# Patient Record
Sex: Female | Born: 2000 | Race: Black or African American | Hispanic: No | Marital: Single | Smoking: Never smoker
Health system: Southern US, Community
[De-identification: ages and names within clinical notes are randomized; demographics above are authoritative.]

## PROBLEM LIST (undated history)

## (undated) DIAGNOSIS — J45909 Unspecified asthma, uncomplicated: Secondary | ICD-10-CM

## (undated) HISTORY — PX: KNEE SURGERY: SHX244

---
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
## 2019-10-01 ENCOUNTER — Encounter (HOSPITAL_COMMUNITY): Payer: Self-pay | Admitting: Emergency Medicine

## 2019-10-01 ENCOUNTER — Emergency Department (HOSPITAL_COMMUNITY): Payer: Medicaid Other

## 2019-10-01 ENCOUNTER — Emergency Department (HOSPITAL_COMMUNITY)
Admission: EM | Admit: 2019-10-01 | Discharge: 2019-10-01 | Disposition: A | Discharge status: Home / Self Care | Payer: Medicaid Other | Attending: Emergency Medicine | Admitting: Emergency Medicine

## 2019-10-01 ENCOUNTER — Other Ambulatory Visit: Payer: Self-pay

## 2019-10-01 DIAGNOSIS — Y999 Unspecified external cause status: Secondary | ICD-10-CM | POA: Insufficient documentation

## 2019-10-01 DIAGNOSIS — Y92410 Unspecified street and highway as the place of occurrence of the external cause: Secondary | ICD-10-CM | POA: Diagnosis not present

## 2019-10-01 DIAGNOSIS — M542 Cervicalgia: Secondary | ICD-10-CM | POA: Diagnosis not present

## 2019-10-01 DIAGNOSIS — M5489 Other dorsalgia: Secondary | ICD-10-CM | POA: Diagnosis present

## 2019-10-01 DIAGNOSIS — Y9389 Activity, other specified: Secondary | ICD-10-CM | POA: Diagnosis not present

## 2019-10-01 MED ORDER — NAPROXEN 500 MG PO TABS: 500.0000 mg | ORAL_TABLET | Freq: Two times a day (BID) | ORAL | 0 refills | Status: DC | PRN

## 2019-10-01 NOTE — ED Notes (Signed)
Patient verbalizes understanding of discharge instructions. Opportunity for questioning and answers were provided. Armband removed by staff, pt discharged from ED.  

## 2019-10-01 NOTE — Discharge Instructions (Addendum)
Please take medications, as prescribed.  Please read the attachment.  Return to the ED or seek medical attention for any new or worsening symptoms.
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
## 2019-11-17 NOTE — L&D Delivery Note (Addendum)
Alyssa Mercado is a 19 y.o.@ s/p vaginal delivery at [redacted]w[redacted]d.  She was admitted for IOL for PPROM @ 35.5w.    ROM: 14h 31m with clear fluid GBS Status: unknown > PCN Maximum Maternal Temperature: 98.33F   Labor Progress: Pt in latent labor on admission with CVE of 2/50/-3.  She continued to progress well after cytotec x1. She progressed to complete then delivered without complication as noted below.   Delivery Date/Time: 10/23 at 1631 Delivery: Called to room and patient was complete and pushing. Head delivered LOA. Nuchal cord x1, easily reduced. Shoulder and body delivered in usual fashion. Infant with spontaneous cry, placed on mother's abdomen, dried and stimulated. Cord clamped x 2 after 1-minute delay, and cut by FOB under my direct supervision. Cord blood drawn. Placenta delivered spontaneously with gentle cord traction. Fundus firm with massage and Pitocin. Labia, perineum, vagina, and cervix were inspected; patient sustained bilateral labial lacerations and a hemostatic first degree perineal laceration.  The labial lacerations were numbed with 1% lidocaine injection and repaired with 3-0 vicryl rapide.    Placenta: intact, 3-vessel cord, sent to L&D Complications: Nuchal cord x1, easily reduced Lacerations: bilateral labial lacerations and a hemostatic first degree perineal laceration EBL: 300 ml Analgesia: epidural   Infant: female  APGARs 8 & 9  weight per medical record  Fayette Pho, MD    Midwife attestation: I was gloved and present for delivery in its entirety and I agree with the above resident's note.  Donette Larry, CNM 6:33 PM

## 2020-03-04 ENCOUNTER — Ambulatory Visit (HOSPITAL_COMMUNITY)
Admission: EM | Admit: 2020-03-04 | Discharge: 2020-03-04 | Disposition: A | Discharge status: Home / Self Care | Payer: Medicaid Other | Attending: Family Medicine | Admitting: Family Medicine

## 2020-03-04 ENCOUNTER — Other Ambulatory Visit: Payer: Self-pay

## 2020-03-04 ENCOUNTER — Encounter (HOSPITAL_COMMUNITY): Payer: Self-pay

## 2020-03-04 DIAGNOSIS — A599 Trichomoniasis, unspecified: Secondary | ICD-10-CM

## 2020-03-04 DIAGNOSIS — Z3201 Encounter for pregnancy test, result positive: Secondary | ICD-10-CM | POA: Diagnosis not present

## 2020-03-04 LAB — POCT URINALYSIS DIP (DEVICE)
Bilirubin Urine: NEGATIVE
Glucose, UA: NEGATIVE mg/dL
Ketones, ur: >=160 mg/dL — AB
Nitrite: POSITIVE — AB
Protein, ur: 30 mg/dL — AB
Specific Gravity, Urine: 1.025 (ref 1.005–1.030)
Urobilinogen, UA: 0.2 mg/dL (ref 0.0–1.0)
pH: 6.5 (ref 5.0–8.0)

## 2020-03-04 LAB — POC URINE PREG, ED: Preg Test, Ur: POSITIVE — AB

## 2020-03-04 LAB — POCT PREGNANCY, URINE: Preg Test, Ur: POSITIVE — AB

## 2020-03-04 MED ORDER — METRONIDAZOLE 500 MG PO TABS: 500.0000 mg | ORAL_TABLET | Freq: Two times a day (BID) | ORAL | 0 refills | Status: DC

## 2020-03-04 NOTE — ED Notes (Signed)
Accessed patient's chart to obtain the orders for the cytology specimen 

## 2020-03-04 NOTE — ED Provider Notes (Signed)
[facility]    CSN: [id number] Arrival date & time: 03/04/20  1441      History   Chief Complaint Chief Complaint  Patient presents with  . Possible Pregnancy    HPI Alyssa Mercado is a 19 y.o. female.   HPI  Patient states that she has been to her primary care doctor.  She was tested and found positive for pregnancy.  She was also tested for STDs.  She is positive for trichomonas.  She received a call from her OB/GYN office.  They stated that she was positive for trichomonas, but they were uncomfortable providing treatments because of her pregnancy status.  She is here today to be treated for trichomonas, confirm her pregnancy, and be referred to OB/GYN for follow-up care. She feels well.  A little fatigue. She states that she has had a couple episodes in the last 3 days of some pink to brown spotting.  Its only present in the morning.  Not the rest of the day.  No cramping or abdominal pain.  I told her that this may be from the infection, and can be followed up by her GYN.  Past Medical History:  Diagnosis Date  . Asthma     There are no problems to display for this patient.   Past Surgical History:  Procedure Laterality Date  . KNEE SURGERY      OB History   No obstetric history on file.      Home Medications    Prior to Admission medications   Medication Sig Start Date End Date Taking? Authorizing Provider  metroNIDAZOLE (FLAGYL) 500 MG tablet Take 1 tablet (500 mg total) by mouth 2 (two) times daily. 03/04/20   Eustace Moore, MD  cetirizine (ZYRTEC) 10 MG tablet Take 1 tablet (10 mg total) by mouth daily. 07/04/16 03/04/20  Tharon Aquas, PA    Family History History reviewed. No pertinent family history.  Social History Social History   Tobacco Use  . Smoking status: Never Smoker  . Smokeless tobacco: Never Used  Substance Use Topics  . Alcohol use: No  . Drug use: Yes    Types: Marijuana     Allergies   Patient  has no known allergies.   Review of Systems Review of Systems  Genitourinary: Positive for menstrual problem and vaginal bleeding.   See HPI  Physical Exam Triage Vital Signs ED Triage Vitals  Enc Vitals Group     BP 03/04/20 1522 104/62     Pulse Rate 03/04/20 1522 76     Resp 03/04/20 1522 16     Temp 03/04/20 1522 98.2 F (36.8 C)     Temp Source 03/04/20 1522 Oral     SpO2 03/04/20 1522 100 %     Weight 03/04/20 1517 116 lb (52.6 kg)     Height --      Head Circumference --      Peak Flow --      Pain Score 03/04/20 1517 5     Pain Loc --      Pain Edu? --      Excl. in GC? --    No data found.  Updated Vital Signs BP 104/62 (BP Location: Right Arm)   Pulse 76   Temp 98.2 F (36.8 C) (Oral)   Resp 16   Wt 52.6 kg   SpO2 100%   BMI 18.72 kg/m     Physical Exam Constitutional:      General:  She is not in acute distress.    Appearance: She is well-developed and normal weight.  HENT:     Head: Normocephalic and atraumatic.     Mouth/Throat:     Comments: Mask is in place Eyes:     Conjunctiva/sclera: Conjunctivae normal.     Pupils: Pupils are equal, round, and reactive to light.  Cardiovascular:     Rate and Rhythm: Normal rate.  Pulmonary:     Effort: Pulmonary effort is normal. No respiratory distress.  Abdominal:     General: There is no distension.     Palpations: Abdomen is soft.     Tenderness: There is no abdominal tenderness.  Musculoskeletal:        General: Normal range of motion.     Cervical back: Normal range of motion.  Skin:    General: Skin is warm and dry.  Neurological:     Mental Status: She is alert.  Psychiatric:        Mood and Affect: Mood normal.        Behavior: Behavior normal.      UC Treatments / Results  Labs (all labs ordered are listed, but only abnormal results are displayed) Labs Reviewed  POC URINE PREG, ED - Abnormal; Notable for the following components:      Result Value   Preg Test, Ur POSITIVE (*)     All other components within normal limits  POCT URINALYSIS DIP (DEVICE) - Abnormal; Notable for the following components:   Ketones, ur >=160 (*)    Hgb urine dipstick TRACE (*)    Protein, ur 30 (*)    Nitrite POSITIVE (*)    Leukocytes,Ua SMALL (*)    All other components within normal limits  POCT PREGNANCY, URINE - Abnormal; Notable for the following components:   Preg Test, Ur POSITIVE (*)    All other components within normal limits    EKG   Radiology No results found.  Procedures Procedures (including critical care time)  Medications Ordered in UC Medications - No data to display  Initial Impression / Assessment and Plan / UC Course  I have reviewed the triage vital signs and the nursing notes.  Pertinent labs & imaging results that were available during my care of the patient were reviewed by me and considered in my medical decision making (see chart for details).     Reviewed early pregnancies.  Diet.  Exercise.  Vitamins.  Supplements. Needs to go to the women's emergency room for any increased bleeding or pain during her pregnancy She can check for her test results in MyChart.  She will be called if anything is positive Final Clinical Impressions(s) / UC Diagnoses   Final diagnoses:  Positive pregnancy test  Trichomonas infection     Discharge Instructions     Take the antibiotic for 7 days No sexual encounters until you finish the antibiotics If you have any bleeding, pain or pregnancy complications go to the [facility] ER Call for an appointment today Take prenatal vitamin daily No tobacco, alcohol or herbals until you see OB      ED Prescriptions    Medication Sig Dispense Auth. Provider   metroNIDAZOLE (FLAGYL) 500 MG tablet Take 1 tablet (500 mg total) by mouth 2 (two) times daily. 14 tablet Raylene Everts, MD     PDMP not reviewed this encounter.   Raylene Everts, MD 03/04/20 [phone]

## 2020-03-04 NOTE — Discharge Instructions (Addendum)
Take the antibiotic for 7 days No sexual encounters until you finish the antibiotics If you have any bleeding, pain or pregnancy complications go to the [facility] ER Call for an appointment today Take prenatal vitamin daily No tobacco, alcohol or herbals until you see OB

## 2020-03-04 NOTE — ED Triage Notes (Signed)
Pt states she is pregnant. Pt states her LMP 12/31/18 she thinks. Pt has been spotting 3 days. Pt states she would like to be tested for STDs.

## 2020-03-05 LAB — CERVICOVAGINAL ANCILLARY ONLY
Chlamydia: NEGATIVE
Comment: NEGATIVE
Comment: NEGATIVE
Comment: NORMAL
Neisseria Gonorrhea: NEGATIVE
Trichomonas: POSITIVE — AB

## 2020-03-06 ENCOUNTER — Telehealth (HOSPITAL_COMMUNITY): Payer: Self-pay

## 2020-03-06 LAB — URINE CULTURE: Culture: >=100000 — AB

## 2020-03-06 MED ORDER — CEPHALEXIN 500 MG PO CAPS: 500.0000 mg | ORAL_CAPSULE | Freq: Two times a day (BID) | ORAL | 0 refills | Status: AC

## 2020-04-08 ENCOUNTER — Ambulatory Visit (INDEPENDENT_AMBULATORY_CARE_PROVIDER_SITE_OTHER): Payer: Medicaid Other | Admitting: *Deleted

## 2020-04-08 ENCOUNTER — Encounter: Payer: Self-pay | Admitting: General Practice

## 2020-04-08 ENCOUNTER — Other Ambulatory Visit: Payer: Self-pay

## 2020-04-08 VITALS — BP 104/59 | HR 81 | Temp 98.1°F | Wt 125.8 lb

## 2020-04-08 DIAGNOSIS — Z3400 Encounter for supervision of normal first pregnancy, unspecified trimester: Secondary | ICD-10-CM | POA: Insufficient documentation

## 2020-04-08 DIAGNOSIS — Z789 Other specified health status: Secondary | ICD-10-CM

## 2020-04-08 MED ORDER — VITAFOL GUMMIES 3.33-0.333-34.8 MG PO CHEW: 3.0000 | CHEWABLE_TABLET | Freq: Every day | ORAL | 12 refills | Status: AC

## 2020-04-08 MED ORDER — BLOOD PRESSURE MONITOR AUTOMAT DEVI: 1.0000 | Freq: Every day | 0 refills | Status: DC

## 2020-04-08 MED ORDER — GOJJI WEIGHT SCALE MISC: 1.0000 | Freq: Every day | 0 refills | Status: DC | PRN

## 2020-04-08 NOTE — Progress Notes (Signed)
   PRENATAL INTAKE SUMMARY  Ms. Hulon presents today New OB Nurse Interview.  OB History    Gravida  2   Para      Term      Preterm      AB  1   Living        SAB      TAB      Ectopic      Multiple      Live Births             I have reviewed the patient's medical, obstetrical, social, and family histories, medications, and available lab results.  SUBJECTIVE She has no unusual complaints  OBJECTIVE Initial nurse interview (New OB)  EDD: 10/07/2020 by LMP GA: [redacted]w[redacted]d G2P0010 FHT: 154  GENERAL APPEARANCE: alert, well appearing, in no apparent distress, oriented to person, place and time   ASSESSMENT Normal pregnancy  PLAN Prenatal care-[facility] Pnl/HIV  OB Urine Culture GC/CT/PAP at next visit with [name], CNM 05/01/20 HgbEval/SMA/CF ([facility]) Panorama RX PNV sent to pharmacy RX BP monitor and weight scale sent to [facility] to sign up for Babyscripts.  Clovis Pu, RN

## 2020-04-08 NOTE — Patient Instructions (Addendum)
 Genetic Screening Results Information: You are having genetic testing called [facility] today.  It will take approximately 2 weeks before the results are available.  To get your results, you need Internet access to a web browser to search [place]/MyChart (the direct app on your phone will not give you these results).  Then select Lab Scanned and click on the blue hyper link that says View Image to see your [facility]ama results.  You can also use the directions on the purple card given to look up your results directly on the [facility] website.   Second Trimester of Pregnancy  The second trimester is from week 14 through week 27 (month 4 through 6). This is often the time in pregnancy that you feel your best. Often times, morning sickness has lessened or quit. You may have more energy, and you may get hungry more often. Your unborn baby is growing rapidly. At the end of the sixth month, he or she is about 9 inches long and weighs about 1 pounds. You will likely feel the baby move between 18 and 20 weeks of pregnancy. Follow these instructions at home: Medicines  Take over-the-counter and prescription medicines only as told by your doctor. Some medicines are safe and some medicines are not safe during pregnancy.  Take a prenatal vitamin that contains at least 600 micrograms (mcg) of folic acid.  If you have trouble pooping (constipation), take medicine that will make your stool soft (stool softener) if your doctor approves. Eating and drinking   Eat regular, healthy meals.  Avoid raw meat and uncooked cheese.  If you get low calcium from the food you eat, talk to your doctor about taking a daily calcium supplement.  Avoid foods that are high in fat and sugars, such as fried and sweet foods.  If you feel sick to your stomach (nauseous) or throw up (vomit): ? Eat 4 or 5 small meals a day instead of 3 large meals. ? Try eating a few soda crackers. ? Drink liquids between meals instead of during  meals.  To prevent constipation: ? Eat foods that are high in fiber, like fresh fruits and vegetables, whole grains, and beans. ? Drink enough fluids to keep your pee (urine) clear or pale yellow. Activity  Exercise only as told by your doctor. Stop exercising if you start to have cramps.  Do not exercise if it is too hot, too humid, or if you are in a place of great height (high altitude).  Avoid heavy lifting.  Wear low-heeled shoes. Sit and stand up straight.  You can continue to have sex unless your doctor tells you not to. Relieving pain and discomfort  Wear a good support bra if your breasts are tender.  Take warm water baths (sitz baths) to soothe pain or discomfort caused by hemorrhoids. Use hemorrhoid cream if your doctor approves.  Rest with your legs raised if you have leg cramps or low back pain.  If you develop puffy, bulging veins (varicose veins) in your legs: ? Wear support hose or compression stockings as told by your doctor. ? Raise (elevate) your feet for 15 minutes, 3-4 times a day. ? Limit salt in your food. Prenatal care  Write down your questions. Take them to your prenatal visits.  Keep all your prenatal visits as told by your doctor. This is important. Safety  Wear your seat belt when driving.  Make a list of emergency phone numbers, including numbers for family, friends, the hospital, and police and   Public relations account executive. General instructions  Ask your doctor about the right foods to eat or for help finding a counselor, if you need these services.  Ask your doctor about local prenatal classes. Begin classes before month 6 of your pregnancy.  Do not use hot tubs, steam rooms, or saunas.  Do not douche or use tampons or scented sanitary pads.  Do not cross your legs for long periods of time.  Visit your dentist if you have not done so. Use a soft toothbrush to brush your teeth. Floss gently.  Avoid all smoking, herbs, and alcohol. Avoid drugs  that are not approved by your doctor.  Do not use any products that contain nicotine or tobacco, such as cigarettes and e-cigarettes. If you need help quitting, ask your doctor.  Avoid cat litter boxes and soil used by cats. These carry germs that can cause birth defects in the baby and can cause a loss of your baby (miscarriage) or stillbirth. Contact a doctor if:  You have mild cramps or pressure in your lower belly.  You have pain when you pee (urinate).  You have bad smelling fluid coming from your vagina.  You continue to feel sick to your stomach (nauseous), throw up (vomit), or have watery poop (diarrhea).  You have a nagging pain in your belly area.  You feel dizzy. Get help right away if:  You have a fever.  You are leaking fluid from your vagina.  You have spotting or bleeding from your vagina.  You have severe belly cramping or pain.  You lose or gain weight rapidly.  You have trouble catching your breath and have chest pain.  You notice sudden or extreme puffiness (swelling) of your face, hands, ankles, feet, or legs.  You have not felt the baby move in over an hour.  You have severe headaches that do not go away when you take medicine.  You have trouble seeing. Summary  The second trimester is from week 14 through week 27 (months 4 through 6). This is often the time in pregnancy that you feel your best.  To take care of yourself and your unborn baby, you will need to eat healthy meals, take medicines only if your doctor tells you to do so, and do activities that are safe for you and your baby.  Call your doctor if you get sick or if you notice anything unusual about your pregnancy. Also, call your doctor if you need help with the right food to eat, or if you want to know what activities are safe for you. This information is not intended to replace advice given to you by your health care provider. Make sure you discuss any questions you have with your health  care provider. Document Revised: 02/24/2019 Document Reviewed: 12/08/2016 Elsevier Patient Education  2020 [employer].  Warning Signs During Pregnancy A pregnancy lasts about 40 weeks, starting from the first day of your last period until the baby is born. Pregnancy is divided into three phases called trimesters.  The first trimester refers to week 1 through week 13 of pregnancy.  The second trimester is the start of week 14 through the end of week 27.  The third trimester is the start of week 28 until you deliver your baby. During each trimester of pregnancy, certain signs and symptoms may indicate a problem. Talk with your health care provider about your current health and any medical conditions you have. Make sure you know the symptoms that you should watch  for and report. How does this affect me?  Warning signs in the first trimester While some changes during the first trimester may be uncomfortable, most do not represent a serious problem. Let your health care provider know if you have any of the following warning signs in the first trimester:  You cannot eat or drink without vomiting, and this lasts for longer than a day.  You have vaginal bleeding or spotting along with menstrual-like cramping.  You have diarrhea for longer than a day.  You have a fever or other signs of infection, such as: ? Pain or burning when you urinate. ? Foul smelling or thick or yellowish vaginal discharge. Warning signs in the second trimester As your baby grows and changes during the second trimester, there are additional signs and symptoms that may indicate a problem. These include:  Signs and symptoms of infection, including a fever.  Signs or symptoms of a miscarriage or preterm labor, such as regular contractions, menstrual-like cramping, or lower abdominal pain.  Bloody or watery vaginal discharge or obvious vaginal bleeding.  Feeling like your heart is pounding.  Having trouble  breathing.  Nausea, vomiting, or diarrhea that lasts for longer than a day.  Craving non-food items, such as clay, chalk, or dirt. This may be a sign of a very treatable medical condition called pica. Later in your second trimester, watch for signs and symptoms of a serious medical condition called preeclampsia.These include:  Changes in your vision.  A severe headache that does not go away.  Nausea and vomiting. It is also important to notice if your baby stops moving or moves less than usual during this time. Warning signs in the third trimester As you approach the third trimester, your baby is growing and your body is preparing for the birth of your baby. In your third trimester, be sure to let your health care provider know if:  You have signs and symptoms of infection, including a fever.  You have vaginal bleeding.  You notice that your baby is moving less than usual or is not moving.  You have nausea, vomiting, or diarrhea that lasts for longer than a day.  You have a severe headache that does not go away.  You have vision changes, including seeing spots or having blurry or double vision.  You have increased swelling in your hands or face. How does this affect my baby? Throughout your pregnancy, always report any of the warning signs of a problem to your health care provider. This can help prevent complications that may affect your baby, including:  Increased risk for premature birth.  Infection that may be transmitted to your baby.  Increased risk for stillbirth. Contact a health care provider if:  You have any of the warning signs of a problem for the current trimester of your pregnancy.  Any of the following apply to you during any trimester of pregnancy: ? You have strong emotions, such as sadness or anxiety, that interfere with work or personal relationships. ? You feel unsafe in your home and need help finding a safe place to live. ? You are using tobacco  products, alcohol, or drugs and you need help to stop. Get help right away if: You have signs or symptoms of labor before 37 weeks of pregnancy. These include:  Contractions that are 5 minutes or less apart, or that increase in frequency, intensity, or length.  Sudden, sharp abdominal pain or low back pain.  Uncontrolled gush or trickle of fluid from   your vagina. Summary  A pregnancy lasts about 40 weeks, starting from the first day of your last period until the baby is born. Pregnancy is divided into three phases called trimesters. Each trimester has warning signs to watch for.  Always report any warning signs to your health care provider in order to prevent complications that may affect both you and your baby.  Talk with your health care provider about your current health and any medical conditions you have. Make sure you know the symptoms that you should watch for and report. This information is not intended to replace advice given to you by your health care provider. Make sure you discuss any questions you have with your health care provider. Document Revised: 02/21/2019 Document Reviewed: 08/19/2017 Elsevier Patient Education  [place] During Pregnancy Genetic testing during pregnancy is also called prenatal genetic testing. This type of testing can determine if your baby is at risk of being born with a disorder caused by abnormal genes or chromosomes (genetic disorder). Chromosomes contain genes that control how your baby will develop in your womb. There are many different genetic disorders. Examples of genetic disorders that may be found through genetic testing include Down syndrome and cystic fibrosis. Gene changes (mutations) can be passed down through families. Genetic testing is offered to all women before or during pregnancy. You can choose whether to have genetic testing. Why is genetic testing done? Genetic testing is done during pregnancy to find out  whether your child is at risk for a genetic disorder. Having genetic testing allows you to:  Discuss your test results and options with a genetic counselor.  Prepare for a baby that may be born with a genetic disorder. Learning about the disorder ahead of time helps you be better prepared to manage it. Your health care providers can also be prepared in case your baby requires special care before or after birth.  Consider whether you want to continue with the pregnancy. In some cases, genetic testing may be done to learn about the traits a child will inherit. Types of genetic tests There are two basic types of genetic testing. Screening tests indicate whether your developing baby (fetus) is at higher risk for a genetic disorder. Diagnostic tests check actual fetal cells to diagnose a genetic disorder. Screening tests     Screening tests will not harm your baby. They are recommended for all pregnant women. Types of screening tests include:  Carrier screening. This test involves checking genes from both parents by testing their blood or saliva. The test checks to find out if the parents carry a genetic mutation that may be passed to a baby. In most cases, both parents must carry the mutation for a baby to be at risk.  First trimester screening. This test combines a blood test with sound wave imaging of your baby (fetal ultrasound). This screening test checks for a risk of Down syndrome or other defects caused by having extra chromosomes. It also checks for defects of the heart, abdomen, or skeleton.  Second trimester screening also combines a blood test with a fetal ultrasound exam. It checks for a risk of genetic defects of the face, brain, spine, heart, or limbs.  Combined or sequential screening. This type of testing combines the results of first and second trimester screening. This type of testing may be more accurate than first or second trimester screening alone.  Cell-free DNA testing.  This is a blood test that detects cells released by  the placenta that get into the mother's blood. It can be used to check for a risk of Down syndrome, other extra chromosome syndromes, and disorders caused by abnormal numbers of sex chromosomes. This test can be done any time after 10 weeks of pregnancy.  Diagnostic tests Diagnostic tests carry slight risks of problems, including bleeding, infection, and loss of the pregnancy. These tests are done only if your baby is at risk for a genetic disorder. You may meet with a genetic counselor to discuss the risks and benefits before having diagnostic tests. Examples of diagnostic tests include:  Chorionic villus sampling (CVS). This involves a procedure to remove and test a sample of cells taken from the placenta. The procedure may be done between 10 and 12 weeks of pregnancy.  Amniocentesis. This involves a procedure to remove and test a sample of fluid (amniotic fluid) and cells from the sac that surrounds the developing baby. The procedure may be done between 15 and 20 weeks of pregnancy. What do the results mean? For a screening test:  If the results are negative, it often means that your child is not at higher risk. There is still a slight chance your child could have a genetic disorder.  If the results are positive, it does not mean your child will have a genetic disorder. It may mean that your child has a higher-than-normal risk for a genetic disorder. In that case, you may want to talk with a genetic counselor about whether you should have diagnostic genetic tests. For a diagnostic test:  If the result is negative, it is unlikely that your child will have a genetic disorder.  If the test is positive for a genetic disorder, it is likely that your child will have the disorder. The test may not tell how severe the disorder will be. Talk with your health care provider about your options. Questions to ask your health care provider Before talking to  your health care provider about genetic testing, find out if there is a history of genetic disorders in your family. It may also help to know your family's ethnic origins. Then ask your health care provider the following questions:  Is my baby at risk for a genetic disorder?  What are the benefits of having genetic screening?  What tests are best for me and my baby?  What are the risks of each test?  If I get a positive result on a screening test, what is the next step?  Should I meet with a genetic counselor before having a diagnostic test?  Should my partner or other members of my family be tested?  How much do the tests cost? Will my insurance cover the testing? Summary  Genetic testing is done during pregnancy to find out whether your child is at risk for a genetic disorder.  Genetic testing is offered to all women before or during pregnancy. You can choose whether to have genetic testing.  There are two basic types of genetic testing. Screening tests indicate whether your developing baby (fetus) is at higher risk for a genetic disorder. Diagnostic tests check actual fetal cells to diagnose a genetic disorder.  If a diagnostic genetic test is positive, talk with your health care provider about your options. This information is not intended to replace advice given to you by your health care provider. Make sure you discuss any questions you have with your health care provider. Document Revised: 02/23/2019 Document Reviewed: 01/17/2018 Elsevier Patient Education  2020 [employer].

## 2020-04-09 LAB — OBSTETRIC PANEL, INCLUDING HIV
Antibody Screen: NEGATIVE
Basophils Absolute: 0 10*3/uL (ref 0.0–0.2)
Basos: 0 %
EOS (ABSOLUTE): 0.2 10*3/uL (ref 0.0–0.4)
Eos: 2 %
HIV Screen 4th Generation wRfx: NONREACTIVE
Hematocrit: 36.3 % (ref 34.0–46.6)
Hemoglobin: 12.5 g/dL (ref 11.1–15.9)
Hepatitis B Surface Ag: NEGATIVE
Immature Grans (Abs): 0 10*3/uL (ref 0.0–0.1)
Immature Granulocytes: 0 %
Lymphocytes Absolute: 3.3 10*3/uL — ABNORMAL HIGH (ref 0.7–3.1)
Lymphs: 31 %
MCH: 33.2 pg — ABNORMAL HIGH (ref 26.6–33.0)
MCHC: 34.4 g/dL (ref 31.5–35.7)
MCV: 96 fL (ref 79–97)
Monocytes Absolute: 0.6 10*3/uL (ref 0.1–0.9)
Monocytes: 6 %
Neutrophils Absolute: 6.5 10*3/uL (ref 1.4–7.0)
Neutrophils: 61 %
Platelets: 348 10*3/uL (ref 150–450)
RBC: 3.77 x10E6/uL (ref 3.77–5.28)
RDW: 12.5 % (ref 11.7–15.4)
RPR Ser Ql: NONREACTIVE
Rh Factor: POSITIVE
Rubella Antibodies, IGG: <0.9 index — ABNORMAL LOW (ref >0.99)
WBC: 10.7 10*3/uL (ref 3.4–10.8)

## 2020-04-09 LAB — HEPATITIS C ANTIBODY: Hep C Virus Ab: <0.1 s/co ratio (ref 0.0–0.9)

## 2020-04-10 LAB — CULTURE, OB URINE

## 2020-04-10 LAB — URINE CULTURE, OB REFLEX

## 2020-04-16 ENCOUNTER — Encounter: Payer: Self-pay | Admitting: General Practice

## 2020-04-16 ENCOUNTER — Ambulatory Visit: Payer: Medicaid Other

## 2020-04-17 ENCOUNTER — Encounter: Payer: Self-pay | Admitting: General Practice

## 2020-05-01 ENCOUNTER — Telehealth: Payer: Self-pay | Admitting: General Practice

## 2020-05-01 ENCOUNTER — Encounter: Payer: Medicaid Other | Admitting: Obstetrics and Gynecology

## 2020-05-01 ENCOUNTER — Encounter: Payer: Self-pay | Admitting: General Practice

## 2020-05-01 NOTE — Telephone Encounter (Signed)
Pt missed NOB appt today.  Letter mailed to patient to give our office a call to reschedule.  Mychart message sent as well.

## 2020-06-06 ENCOUNTER — Ambulatory Visit (INDEPENDENT_AMBULATORY_CARE_PROVIDER_SITE_OTHER): Payer: Medicaid Other | Admitting: Obstetrics and Gynecology

## 2020-06-06 ENCOUNTER — Encounter: Payer: Self-pay | Admitting: General Practice

## 2020-06-06 ENCOUNTER — Encounter: Payer: Self-pay | Admitting: Obstetrics and Gynecology

## 2020-06-06 ENCOUNTER — Other Ambulatory Visit: Payer: Self-pay

## 2020-06-06 ENCOUNTER — Other Ambulatory Visit (HOSPITAL_COMMUNITY)
Admission: RE | Admit: 2020-06-06 | Discharge: 2020-06-06 | Disposition: A | Discharge status: Home / Self Care | Payer: Medicaid Other | Source: Ambulatory Visit | Attending: Obstetrics and Gynecology | Admitting: Obstetrics and Gynecology

## 2020-06-06 VITALS — BP 105/67 | HR 76 | Temp 97.9°F | Wt 136.2 lb

## 2020-06-06 DIAGNOSIS — Z124 Encounter for screening for malignant neoplasm of cervix: Secondary | ICD-10-CM

## 2020-06-06 DIAGNOSIS — O210 Mild hyperemesis gravidarum: Secondary | ICD-10-CM | POA: Diagnosis not present

## 2020-06-06 DIAGNOSIS — K219 Gastro-esophageal reflux disease without esophagitis: Secondary | ICD-10-CM | POA: Insufficient documentation

## 2020-06-06 DIAGNOSIS — Z3400 Encounter for supervision of normal first pregnancy, unspecified trimester: Secondary | ICD-10-CM | POA: Diagnosis present

## 2020-06-06 DIAGNOSIS — O23592 Infection of other part of genital tract in pregnancy, second trimester: Secondary | ICD-10-CM

## 2020-06-06 DIAGNOSIS — B9689 Other specified bacterial agents as the cause of diseases classified elsewhere: Secondary | ICD-10-CM

## 2020-06-06 DIAGNOSIS — O99612 Diseases of the digestive system complicating pregnancy, second trimester: Secondary | ICD-10-CM

## 2020-06-06 DIAGNOSIS — O285 Abnormal chromosomal and genetic finding on antenatal screening of mother: Secondary | ICD-10-CM

## 2020-06-06 DIAGNOSIS — Z3A22 22 weeks gestation of pregnancy: Secondary | ICD-10-CM

## 2020-06-06 MED ORDER — PANTOPRAZOLE SODIUM 40 MG PO TBEC: 40.0000 mg | DELAYED_RELEASE_TABLET | Freq: Every day | ORAL | 0 refills | Status: DC

## 2020-06-06 MED ORDER — ONDANSETRON 4 MG PO TBDP
8.0000 mg | ORAL_TABLET | Freq: Once | ORAL | Status: AC
  Administered 2020-06-06: 8 mg via ORAL

## 2020-06-06 MED ORDER — PROMETHAZINE HCL 25 MG PO TABS: 25.0000 mg | ORAL_TABLET | Freq: Four times a day (QID) | ORAL | 1 refills | Status: DC | PRN

## 2020-06-06 NOTE — Progress Notes (Signed)
INITIAL OBSTETRICAL VISIT Patient name: Alyssa Mercado MRN [id number]  Date of birth: [birth year] Chief Complaint:   Initial Prenatal Visit  History of Present Illness:   Alyssa Mercado is a 19 y.o. G56P0010 African American female at [redacted]w[redacted]d by LMP with an Estimated Date of Delivery: 10/07/20 being seen today for her initial obstetrical visit.  Her obstetrical history is significant for none. She is Rubella Non-Immune, SMA carrier, GERD and late to prenatal care. This is an unplanned pregnancy. She and the father of the baby (FOB) "Shaka" live together. She has a support system that consists of the FOB and her family and friends. Today she reports heartburn, nausea and vomiting.   Patient's last menstrual period was 01/01/2020 (within weeks). Last pap n/a. Results were: n/a Review of Systems:   Pertinent items are noted in HPI Denies cramping/contractions, leakage of fluid, vaginal bleeding, abnormal vaginal discharge w/ itching/odor/irritation, headaches, visual changes, shortness of breath, chest pain, abdominal pain, severe nausea/vomiting, or problems with urination or bowel movements unless otherwise stated above.  Pertinent History Reviewed:  Reviewed past medical,surgical, social, obstetrical and family history.  Reviewed problem list, medications and allergies. OB History  Gravida Para Term Preterm AB Living  2       1    SAB TAB Ectopic Multiple Live Births               # Outcome Date GA Lbr Len/2nd Weight Sex Delivery Anes PTL Lv  2 Current           1 AB 2017           Physical Assessment:   Vitals:   06/06/20 0951  BP: 105/67  Pulse: 76  Temp: 97.9 F (36.6 C)  Weight: 136 lb 3.2 oz (61.8 kg)  Body mass index is 21.98 kg/m.       Physical Examination:  General appearance - well appearing, and in no distress  Mental status - alert, oriented to person, place, and time  Psych:  She has a normal mood and affect  Skin - warm and dry,  normal color, no suspicious lesions noted  Chest - effort normal, all lung fields clear to auscultation bilaterally  Heart - normal rate and regular rhythm  Abdomen - soft, nontender  Extremities:  No swelling or varicosities noted  Pelvic - VULVA: normal appearing vulva with no masses, tenderness or lesions  VAGINA: normal appearing vagina with normal color and discharge, no lesions.   CERVIX: normal appearing cervix without discharge or lesions, no CMT  Thin prep pap is done with reflex HR HPV cotesting   FHTs by doppler: 140 bpm  Assessment & Plan:  1) Low-Risk Pregnancy G2P0010 at [redacted]w[redacted]d with an Estimated Date of Delivery: 10/07/20   2) Initial OB visit - Welcomed to practice and introduced self to patient in addition to discussing other advanced practice providers that she may be seeing at this practice - Congratulated patient - Anticipatory guidance on upcoming appointments - Educated on COVID19 and pregnancy and the integration of virtual appointments  - Educated on babyscripts app- patient reports she has not received email, encouraged to look in spam folder and to call office if she still has not received email - patient verbalizes understanding    3) Supervision of normal first pregnancy, antepartum - [place] MFM OB COMP + 14 WK; Future - Cytology - PAP( [facility]) - Cervicovaginal ancillary only( [facility])  4) Morning sickness - Rx for ondansetron (ZOFRAN-ODT) disintegrating  tablet 8 mg - Information provided on morning sickness   5) Gastroesophageal reflux during pregnancy in second trimester, antepartum - Rx for Protonix 40 mg daily - Information provided on GERD in pregnancy   6) Abnormal genetic test during pregnancy  - AMB MFM GENETICS REFERRAL    Meds:  Meds ordered this encounter  Medications  . ondansetron (ZOFRAN-ODT) disintegrating tablet 8 mg  . promethazine (PHENERGAN) 25 MG tablet    Sig: Take 1 tablet (25 mg total) by mouth every 6 (six) hours as  needed for nausea or vomiting.    Dispense:  30 tablet    Refill:  1  . pantoprazole (PROTONIX) 40 MG tablet    Sig: Take 1 tablet (40 mg total) by mouth daily.    Dispense:  30 tablet    Refill:  0    Order Specific Question:   Supervising Provider    Answer:   Reva Bores [[id number]]    Initial labs obtained Continue prenatal vitamins Reviewed n/v relief measures and warning s/s to report Reviewed recommended weight gain based on pre-gravid BMI Encouraged well-balanced diet Genetic Screening discussed: results reviewed Cystic fibrosis, SMA, Fragile X screening discussed results reviewed The nature of [facility] with multiple MDs and other Advanced Practice Providers was explained to patient; also emphasized that residents, students are part of our team.  Discussed optimized OB schedule and video visits. Advised can have an in-office visit whenever she feels she needs to be seen.  Does not have own BP cuff. BP cuff Rx faxed today. Explained to patient that BP will be mailed to her house. Check BP weekly, let us know if >140/90. Advised to call during normal business hours and there is an after-hours nurse line available.    Follow-up: Return in about 6 weeks (around 07/18/2020) for Return OB 2hr GTT.   Orders Placed This Encounter  Procedures  . [place] MFM OB COMP + 14 WK  . AMB MFM GENETICS REFERRAL    [name] MSN, CNM 06/06/2020

## 2020-06-06 NOTE — Patient Instructions (Signed)
Glucose Tolerance Test During Pregnancy Why am I having this test? The glucose tolerance test (GTT) is done to check how your body processes sugar (glucose). This is one of several tests used to diagnose diabetes that develops during pregnancy (gestational diabetes mellitus). Gestational diabetes is a temporary form of diabetes that some women develop during pregnancy. It usually occurs during the second trimester of pregnancy and goes away after delivery. Testing (screening) for gestational diabetes usually occurs between 24 and 28 weeks of pregnancy. You may have the GTT test after having a 1-hour glucose screening test if the results from that test indicate that you may have gestational diabetes. You may also have this test if:  You have a history of gestational diabetes.  You have a history of giving birth to very large babies or have experienced repeated fetal loss (stillbirth).  You have signs and symptoms of diabetes, such as: ? Changes in your vision. ? Tingling or numbness in your hands or feet. ? Changes in hunger, thirst, and urination that are not otherwise explained by your pregnancy. What is being tested? This test measures the amount of glucose in your blood at different times during a period of 3 hours. This indicates how well your body is able to process glucose. What kind of sample is taken?  Blood samples are required for this test. They are usually collected by inserting a needle into a blood vessel. How do I prepare for this test?  For 3 days before your test, eat normally. Have plenty of carbohydrate-rich foods.  Follow instructions from your health care provider about: ? Eating or drinking restrictions on the day of the test. You may be asked to not eat or drink anything other than water (fast) starting 8-10 hours before the test. ? Changing or stopping your regular medicines. Some medicines may interfere with this test. Tell a health care provider about:  All  medicines you are taking, including vitamins, herbs, eye drops, creams, and over-the-counter medicines.  Any blood disorders you have.  Any surgeries you have had.  Any medical conditions you have. What happens during the test? First, your blood glucose will be measured. This is referred to as your fasting blood glucose, since you fasted before the test. Then, you will drink a glucose solution that contains a certain amount of glucose. Your blood glucose will be measured again 1, 2, and 3 hours after drinking the solution. This test takes about 3 hours to complete. You will need to stay at the testing location during this time. During the testing period:  Do not eat or drink anything other than the glucose solution.  Do not exercise.  Do not use any products that contain nicotine or tobacco, such as cigarettes and e-cigarettes. If you need help stopping, ask your health care provider. The testing procedure may vary among health care providers and hospitals. How are the results reported? Your results will be reported as milligrams of glucose per deciliter of blood (mg/dL) or millimoles per liter (mmol/L). Your health care provider will compare your results to normal ranges that were established after testing a large group of people (reference ranges). Reference ranges may vary among labs and hospitals. For this test, common reference ranges are:  Fasting: less than 95-105 mg/dL (5.3-5.8 mmol/L).  1 hour after drinking glucose: less than 180-190 mg/dL (10.0-10.5 mmol/L).  2 hours after drinking glucose: less than 155-165 mg/dL (8.6-9.2 mmol/L).  3 hours after drinking glucose: 140-145 mg/dL (7.8-8.1 mmol/L). What do the   results mean? Results within reference ranges are considered normal, meaning that your glucose levels are well-controlled. If two or more of your blood glucose levels are high, you may be diagnosed with gestational diabetes. If only one level is high, your health care  provider may suggest repeat testing or other tests to confirm a diagnosis. Talk with your health care provider about what your results mean. Questions to ask your health care provider Ask your health care provider, or the department that is doing the test:  When will my results be ready?  How will I get my results?  What are my treatment options?  What other tests do I need?  What are my next steps? Summary  The glucose tolerance test (GTT) is one of several tests used to diagnose diabetes that develops during pregnancy (gestational diabetes mellitus). Gestational diabetes is a temporary form of diabetes that some women develop during pregnancy.  You may have the GTT test after having a 1-hour glucose screening test if the results from that test indicate that you may have gestational diabetes. You may also have this test if you have any symptoms or risk factors for gestational diabetes.  Talk with your health care provider about what your results mean. This information is not intended to replace advice given to you by your health care provider. Make sure you discuss any questions you have with your health care provider. Document Revised: 02/23/2019 Document Reviewed: 06/14/2017 Elsevier Patient Education  2020 Else[employer] Inc.   Morning Sickness  Morning sickness is when a woman feels nauseous during pregnancy. This nauseous feeling may or may not come with vomiting. It often occurs in the morning, but it can be a problem at any time of day. Morning sickness is most common during the first trimester. In some cases, it may continue throughout pregnancy. Although morning sickness is unpleasant, it is usually harmless unless the woman develops severe and continual vomiting (hyperemesis gravidarum), a condition that requires more intense treatment. What are the causes? The exact cause of this condition is not known, but it seems to be related to normal hormonal changes that occur in  pregnancy. What increases the risk? You are more likely to develop this condition if:  You experienced nausea or vomiting before your pregnancy.  You had morning sickness during a previous pregnancy.  You are pregnant with more than one baby, such as twins. What are the signs or symptoms? Symptoms of this condition include:  Nausea.  Vomiting. How is this diagnosed? This condition is usually diagnosed based on your signs and symptoms. How is this treated? In many cases, treatment is not needed for this condition. Making some changes to what you eat may help to control symptoms. Your health care provider may also prescribe or recommend:  Vitamin B6 supplements.  Anti-nausea medicines.  Ginger. Follow these instructions at home: Medicines  Take over-the-counter and prescription medicines only as told by your health care provider. Do not use any prescription, over-the-counter, or herbal medicines for morning sickness without first talking with your health care provider.  Taking multivitamins before getting pregnant can prevent or decrease the severity of morning sickness in most women. Eating and drinking  Eat a piece of dry toast or crackers before getting out of bed in the morning.  Eat 5 or 6 small meals a day.  Eat dry and bland foods, such as rice or a baked potato. Foods that are high in carbohydrates are often helpful.  Avoid greasy, fatty, and spicy  foods.  Have someone cook for you if the smell of any food causes nausea and vomiting.  If you feel nauseous after taking prenatal vitamins, take the vitamins at night or with a snack.  Snack on protein foods between meals if you are hungry. Nuts, yogurt, and cheese are good options.  Drink fluids throughout the day.  Try ginger ale made with real ginger, ginger tea made from fresh grated ginger, or ginger candies. General instructions  Do not use any products that contain nicotine or tobacco, such as cigarettes  and e-cigarettes. If you need help quitting, ask your health care provider.  Get an air purifier to keep the air in your house free of odors.  Get plenty of fresh air.  Try to avoid odors that trigger your nausea.  Consider trying these methods to help relieve symptoms: ? Wearing an acupressure wristband. These wristbands are often worn for seasickness. ? Acupuncture. Contact a health care provider if:  Your home remedies are not working and you need medicine.  You feel dizzy or light-headed.  You are losing weight. Get help right away if:  You have persistent and uncontrolled nausea and vomiting.  You faint.  You have severe pain in your abdomen. Summary  Morning sickness is when a woman feels nauseous during pregnancy. This nauseous feeling may or may not come with vomiting.  Morning sickness is most common during the first trimester.  It often occurs in the morning, but it can be a problem at any time of day.  In many cases, treatment is not needed for this condition. Making some changes to what you eat may help to control symptoms. This information is not intended to replace advice given to you by your health care provider. Make sure you discuss any questions you have with your health care provider. Document Revised: 10/15/2017 Document Reviewed: 12/05/2016 Elsevier Patient Education  2020 Elsevier Inc.   Heartburn During Pregnancy  Heartburn is pain or discomfort in the throat or chest. It may cause a burning feeling. It happens when stomach acid moves up into the tube that carries food from your mouth to your stomach (esophagus). Heartburn is common during pregnancy. It usually goes away or gets better after giving birth. Follow these instructions at home: Eating and drinking  Do not drink alcohol while you are pregnant.  Figure out which foods and beverages make you feel worse, and avoid them.  Beverages that you may want to avoid include: ? Coffee and tea  (with or without caffeine). ? Energy drinks and sports drinks. ? Bubbly (carbonated) drinks or sodas. ? Citrus fruit juices.  Foods that you may want to avoid include: ? Chocolate and cocoa. ? Peppermint and mint flavorings. ? Garlic, onions, and horseradish. ? Spicy and acidic foods. These include peppers, chili powder, curry powder, vinegar, hot sauces, and barbecue sauce. ? Citrus fruits, such as oranges, lemons, and limes. ? Tomato-based foods, such as red sauce, chili, and salsa. ? Fried and fatty foods, such as donuts, french fries, potato chips, and high-fat dressings. ? High-fat meats, such as hot dogs, cold cuts, sausage, ham, and bacon. ? High-fat dairy items, such as whole milk, butter, and cheese.  Eat small meals often, instead of large meals.  Avoid drinking a lot of liquid with your meals.  Avoid eating meals during the 2-3 hours before you go to bed.  Avoid lying down right after you eat.  Do not exercise right after you eat. Medicines  Take over-the-counter  and prescription medicines only as told by your doctor.  Do not take aspirin, ibuprofen, or other NSAIDs unless your doctor tells you to do that.  Your doctor may tell you to avoid medicines that have sodium bicarbonate in them. General instructions   If told, raise the head of your bed about 6 inches (15 cm). You can do this by putting blocks under the legs. Sleeping with more pillows does not help with heartburn.  Do not use any products that contain nicotine or tobacco, such as cigarettes and e-cigarettes. If you need help quitting, ask your doctor.  Wear loose-fitting clothing.  Try to lower your stress, such as with yoga or meditation. If you need help, ask your doctor.  Stay at a healthy weight. If you are overweight, work with your doctor to safely lose weight.  Keep all follow-up visits as told by your doctor. This is important. Contact a doctor if:  You get new symptoms.  Your symptoms do  not get better with treatment.  You have weight loss and you do not know why.  You have trouble swallowing.  You make loud sounds when you breathe (wheeze).  You have a cough that does not go away.  You have heartburn often for more than 2 weeks.  You feel sick to your stomach (nauseous), and this does not get better with treatment.  You are throwing up (vomiting), and this does not get better with treatment.  You have pain in your belly (abdomen). Get help right away if:  You have very bad chest pain that spreads to your arm, neck, or jaw.  You feel sweaty, dizzy, or light-headed.  You have trouble breathing.  You have pain when swallowing.  You throw up and your throw-up looks like blood or coffee grounds.  Your poop (stool) is bloody or black. This information is not intended to replace advice given to you by your health care provider. Make sure you discuss any questions you have with your health care provider. Document Revised: 02/23/2019 Document Reviewed: 07/20/2016 Elsevier Patient Education  2020 [employer].

## 2020-06-07 ENCOUNTER — Other Ambulatory Visit: Payer: Self-pay | Admitting: Obstetrics and Gynecology

## 2020-06-07 DIAGNOSIS — B9689 Other specified bacterial agents as the cause of diseases classified elsewhere: Secondary | ICD-10-CM

## 2020-06-07 LAB — CYTOLOGY - PAP: Diagnosis: NEGATIVE

## 2020-06-07 LAB — CERVICOVAGINAL ANCILLARY ONLY
Bacterial Vaginitis (gardnerella): POSITIVE — AB
Candida Glabrata: NEGATIVE
Candida Vaginitis: NEGATIVE
Chlamydia: NEGATIVE
Comment: NEGATIVE
Comment: NEGATIVE
Comment: NEGATIVE
Comment: NEGATIVE
Comment: NEGATIVE
Comment: NORMAL
Neisseria Gonorrhea: NEGATIVE
Trichomonas: NEGATIVE

## 2020-06-07 MED ORDER — METRONIDAZOLE 0.75 % VA GEL: 1.0000 | VAGINAL | 0 refills | Status: AC

## 2020-06-14 ENCOUNTER — Other Ambulatory Visit: Payer: Self-pay

## 2020-06-14 ENCOUNTER — Other Ambulatory Visit: Payer: Self-pay | Admitting: *Deleted

## 2020-06-14 ENCOUNTER — Ambulatory Visit: Payer: Medicaid Other | Attending: Obstetrics and Gynecology

## 2020-06-14 DIAGNOSIS — Z3A23 23 weeks gestation of pregnancy: Secondary | ICD-10-CM

## 2020-06-14 DIAGNOSIS — O0933 Supervision of pregnancy with insufficient antenatal care, third trimester: Secondary | ICD-10-CM

## 2020-06-14 DIAGNOSIS — Z3400 Encounter for supervision of normal first pregnancy, unspecified trimester: Secondary | ICD-10-CM

## 2020-06-14 DIAGNOSIS — O43193 Other malformation of placenta, third trimester: Secondary | ICD-10-CM

## 2020-06-14 DIAGNOSIS — Z148 Genetic carrier of other disease: Secondary | ICD-10-CM

## 2020-06-14 DIAGNOSIS — Z363 Encounter for antenatal screening for malformations: Secondary | ICD-10-CM | POA: Diagnosis not present

## 2020-06-14 DIAGNOSIS — O0932 Supervision of pregnancy with insufficient antenatal care, second trimester: Secondary | ICD-10-CM

## 2020-06-17 ENCOUNTER — Ambulatory Visit: Payer: Self-pay | Admitting: Genetic Counselor

## 2020-06-17 ENCOUNTER — Ambulatory Visit: Payer: Medicaid Other | Attending: Obstetrics and Gynecology | Admitting: Genetic Counselor

## 2020-06-17 ENCOUNTER — Encounter: Payer: Self-pay | Admitting: Genetic Counselor

## 2020-06-17 ENCOUNTER — Other Ambulatory Visit: Payer: Self-pay

## 2020-06-17 DIAGNOSIS — Z148 Genetic carrier of other disease: Secondary | ICD-10-CM

## 2020-06-17 DIAGNOSIS — Z315 Encounter for genetic counseling: Secondary | ICD-10-CM

## 2020-06-17 NOTE — Progress Notes (Signed)
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 Alyssa Mercado came to her appointment alone.   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 female. Her current pregnancy has completed [redacted]w[redacted]d (Estimated Date of Delivery: 10/07/20). Alyssa Mercado has had two elective terminations with prior partners.  Alyssa Mercado denied exposure to environmental toxins or chemical agents. She denied the use of alcohol, tobacco or street drugs. She reported taking prenatal vitamins and Promethazine. She denied significant viral illnesses, fevers, and bleeding during the course of her pregnancy. Her medical and surgical histories were noncontributory.  Family History  A three generation pedigree was drafted and reviewed. The family history is remarkable for the following:  - Alyssa Mercado reported that she was born with her "knee backward" which required immediately surgery after birth. We discussed that 3-5% of all pregnancies in the [place] are affected by some sort of birth defect. Isolated birth defects often occur due to a combination of genetic, lifestyle, and environmental factors that are difficult to identify. Given Alyssa Mercado's personal history, her children could possibly have an increased chance of being affected by a birth defect. However, precise risk assessment is limited. We reviewed that Alyssa Mercado's anatomy ultrasound appeared normal with some views limited due to fetal position. However, she understands that ultrasounds cannot rule out all birth defects or genetic syndromes.  - Alyssa Mercado has a maternal half sister who was born with a "hole in her heart" that  never required surgery. The exact etiology of this congenital heart defect (CHD) is unknown. There are multifactorial causes for isolated CHDs, including environmental and genetic factors. The chance that Alyssa Mercado could have a child with a CHD is likely not greatly elevated over the 0.5-1% population risk. However, without knowing the exact etiology of her sister's CHD, risk assessment is limited.  The remaining family histories were reviewed and found to be noncontributory for birth defects, intellectual disability, recurrent pregnancy loss, and known genetic conditions. Alyssa Mercado had limited information about her partner's extended family history; thus, risk assessment was limited.  The patient's ethnicity is African American. The father of the pregnancy's ethnicity is African. Ashkenazi Jewish ancestry and consanguinity were denied. Pedigree will be scanned under Media.  Discussion  Spinal muscular atrophy:  Alyssa Mercado had Horizon-14 carrier screening performed through [place]. 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 This puts her at increased risk (1 in 34, or ~3%) to be a silent 2+0 carrier for SMA. SMA is a condition caused by mutations in the SMN1 gene. Typically, individuals have two copies of the SMN1 gene, with one copy present on each chromosome. In SMA silent carriers, both copies of the SMN1 gene are present on one chromosome, with no copies of SMN1 present on the other chromosome.    SMA is characterized by progressive muscle weakness and atrophy due to degeneration and loss of anterior horn cells (lower motor neurons) in the spinal cord and brain stem. We discussed the different types of SMA (0, I, II, III, and IV), including differences in severity and age of  onset. We discussed that the number of copies of another  related gene called SMN2 modifies the severity of the condition and helps determine which form of SMA an affected individual will develop. Having multiple copies of the SMN2 gene is usually associated with less severe features and later onset of symptoms.  We also reviewed the autosomal recessive inheritance pattern of SMA. We discussed that the couple only has a chance of having a child with SMA if both parents are identified as carriers for the condition. Based on the carrier frequency for SMA in the African population, Alyssa Mercado partner currently has a 1 in 76 chance of being a carrier of SMA. Without partner screening to refine risk and based on her partner's ethnicity, the couple currently has a 1 in 8976 (0.01%) chance of having a child with SMA. If Alyssa Mercado's partner were found to have 2 copies of SMN1, his chance of being a carrier would be reduced but not eliminated. However, if both parents are carriers of SMA, there would be a 1 in 4 (25%) chance of having an affected fetus.   Alyssa Mercado carrier screening was negative for the other 13 conditions screened. Thus, her risk to be a carrier for these additional conditions (listed separately in the laboratory report) has been reduced but not eliminated. This also significantly reduces her risk of having a child affected by one of these conditions. We discussed that carrier testing for SMA is recommended for Alyssa Mercado's partner. Alyssa Mercado indicated that she is/is not interested in pursuing partner carrier screening. I also informed Alyssa Mercado that SMA is included on [place]'s newborn screen.  Aneuploidy screening results:  We also reviewed that [name] had Panorama NIPS through the laboratory [facility] that was low-risk for fetal aneuploidies. We reviewed that these results showed a less than 1 in 10,000 risk for trisomies 21, 18 and 13, and monosomy X (Turner syndrome). In addition, the risk for  triploidy and sex chromosome trisomies (47,XXX and 47,XXY) was also low. [name] elected to have cfDNA analysis for 22q11.2 deletion syndrome, which was also low risk (1 in 9000). We reviewed that while this testing identifies 94-99% of pregnancies with trisomy 10, trisomy 67, trisomy 32, sex chromosome aneuploidies, and triploidy, it is NOT diagnostic. A positive test result requires confirmation by CVS or amniocentesis, and a negative test result does not rule out a fetal chromosome abnormality. She also understands that this testing does not identify all genetic conditions.  Diagnostic testing:  Ms. Araque was also counseled regarding diagnostic testing via amniocentesis. We discussed the technical aspects of the procedure and quoted up to a 1 in 500 (0.2%) risk for preterm labor or other adverse pregnancy outcomes as a result of amniocentesis. Cultured cells from an amniocentesis sample allow for the visualization of a fetal karyotype, which can detect >99% of chromosomal aberrations. Chromosomal microarray can also be performed to identify smaller deletions or duplications of fetal chromosomal material. Amniocentesis could also be performed to assess whether the baby is affected by SMA, and could determine the expected severity of the condition based on the number of copies of SMN2 the fetus has. After careful consideration, Ms. Herford declined amniocentesis at this time. She understands that amniocentesis is available at any point after 16 weeks of pregnancy and that she may opt to undergo the procedure at a later date should she change her mind.  Plan:  Ms. Kasparek is interested in pursuing SMA carrier screening for her partner,  Gwendel Hanson; however, she is not sure if he has health insurance or not. We made a plan for her to talk with her partner and determine if he is interested in testing. If he has insurance, she will email me a Building services engineer of Mr. Michaelle Copas insurance card so that I  can perform a benefits investigation to estimate the couple's out of pocket cost for testing. I informed Ms. Felicetti that if Mr. Katrinka Blazing does not have insurance, he will likely qualify for free testing through [employer]'s Patient Assistance Program.  Ms. Tweten requested that I email her some information about SMA and the couple's chances of having an affected child so that she can review it all with her partner. Following our session, I emailed Ms. Scialdone information about SMA from [employer] and a copy of the visual aid that I created reviewing her results.  I counseled Ms. Lewing regarding the above risks and available options. The approximate face-to-face time with the genetic counselor was 30 minutes.  In summary:  Discussed carrier screening results and options for follow-up testing  Increased risk (1 in 34, ~3%) of being silent carrier for spinal muscular atrophy  Desires partner carrier screening. Will discuss testing with her partner, determine if he has insurance, and contact me to facilitate testing from there  Reviewed low-risk NIPS result  Reduction in risk for Down syndrome, trisomy 78, trisomy 41, triploidy, sex chromosome aneuploidies, and 22q11.2 deletion syndrome  Offered additional testing and screening  Declined amniocentesis  Reviewed family history concerns   Gershon Crane, MS, Aeronautical engineer

## 2020-07-18 ENCOUNTER — Encounter: Payer: Self-pay | Admitting: General Practice

## 2020-07-18 ENCOUNTER — Encounter: Payer: Self-pay | Admitting: Obstetrics and Gynecology

## 2020-07-18 ENCOUNTER — Ambulatory Visit (INDEPENDENT_AMBULATORY_CARE_PROVIDER_SITE_OTHER): Payer: Medicaid Other | Admitting: Obstetrics and Gynecology

## 2020-07-18 ENCOUNTER — Other Ambulatory Visit: Payer: Self-pay

## 2020-07-18 VITALS — BP 107/71 | HR 81 | Temp 98.0°F | Wt 144.6 lb

## 2020-07-18 DIAGNOSIS — R102 Pelvic and perineal pain: Secondary | ICD-10-CM

## 2020-07-18 DIAGNOSIS — O99891 Other specified diseases and conditions complicating pregnancy: Secondary | ICD-10-CM

## 2020-07-18 DIAGNOSIS — Z3400 Encounter for supervision of normal first pregnancy, unspecified trimester: Secondary | ICD-10-CM | POA: Diagnosis not present

## 2020-07-18 DIAGNOSIS — M549 Dorsalgia, unspecified: Secondary | ICD-10-CM

## 2020-07-18 DIAGNOSIS — O26899 Other specified pregnancy related conditions, unspecified trimester: Secondary | ICD-10-CM

## 2020-07-18 MED ORDER — COMFORT FIT MATERNITY SUPP SM MISC: 1.0000 | Freq: Every day | 0 refills | Status: DC | PRN

## 2020-07-18 NOTE — Progress Notes (Signed)
LOW-RISK PREGNANCY OFFICE VISIT Patient name: Alyssa Mercado MRN [id number]  Date of birth: [birth year] Chief Complaint:   Routine Prenatal Visit  History of Present Illness:   Alyssa Mercado is a 19 y.o. G3P0020 female at [redacted]w[redacted]d with an Estimated Date of Delivery: 10/07/20 being seen today for ongoing management of a low-risk pregnancy.  Today she reports backache and pelvic pain. Contractions: Not present. Vag. Bleeding: None.  Movement: Present. denies leaking of fluid. Review of Systems:   Pertinent items are noted in HPI Denies abnormal vaginal discharge w/ itching/odor/irritation, headaches, visual changes, shortness of breath, chest pain, abdominal pain, severe nausea/vomiting, or problems with urination or bowel movements unless otherwise stated above. Pertinent History Reviewed:  Reviewed past medical,surgical, social, obstetrical and family history.  Reviewed problem list, medications and allergies. Physical Assessment:   Vitals:   07/18/20 0822  BP: 107/71  Pulse: 81  Temp: 98 F (36.7 C)  Weight: 144 lb 9.6 oz (65.6 kg)  Body mass index is 23.34 kg/m.        Physical Examination:   General appearance: Well appearing, and in no distress  Mental status: Alert, oriented to person, place, and time  Skin: Warm & dry  Cardiovascular: Normal heart rate noted  Respiratory: Normal respiratory effort, no distress  Abdomen: Soft, gravid, nontender  Pelvic: Cervical exam deferred         Extremities: Edema: None  Fetal Status: Fetal Heart Rate (bpm): 137 Fundal Height: 30 cm Movement: Present    No results found for this or any previous visit (from the past 24 hour(s)).  Assessment & Plan:  1) Low-risk pregnancy G3P0020 at [redacted]w[redacted]d with an Estimated Date of Delivery: 10/07/20   2) Supervision of normal first pregnancy, antepartum  - HIV Antibody (routine testing w rflx),  - RPR,  - CBC,  - Glucose Tolerance, 2 Hours w/1 Hour,    3) Back pain  affecting pregnancy, antepartum  - Rx for Elastic Bandages & Supports (COMFORT FIT MATERNITY SUPP SM) MISC  Pelvic pressure in pregnancy, antepartum  - Rx for Elastic Bandages & Supports (COMFORT FIT MATERNITY SUPP SM) MISC   PREGNANCY SUPPORT BELT: You are not alone, Seventy-five percent of women have some sort of abdominal or back pain at some point in their pregnancy. Your baby is growing at a fast pace, which means that your whole body is rapidly trying to adjust to the changes. As your uterus grows, your back may start feeling a bit under stress and this can result in back or abdominal pain that can go from mild, and therefore bearable, to severe pains that will not allow you to sit or lay down comfortably, When it comes to dealing with pregnancy-related pains and cramps, some pregnant women usually prefer natural remedies, which the market is filled with nowadays. For example, wearing a pregnancy support belt can help ease and lessen your discomfort and pain. WHAT ARE THE BENEFITS OF WEARING A PREGNANCY SUPPORT BELT? A pregnancy support belt provides support to the lower portion of the belly taking some of the weight of the growing uterus and distributing to the other parts of your body. It is designed make you comfortable and gives you extra support. Over the years, the pregnancy apparel market has been studying the needs and wants of pregnant women and they have come up with the most comfortable pregnancy support belts that woman could ever ask for. In fact, you will no longer have to wear a stretched-out or bulky  pregnancy belt that is visible underneath your clothes and makes you feel even more uncomfortable. Nowadays, a pregnancy support belt is made of comfortable and stretchy materials that will not irritate your skin but will actually make you feel at ease and you will not even notice you are wearing it. They are easy to put on and adjust during the day and can be worn at night for additional  support.  BENEFITS: . Relives Back pain . Relieves Abdominal Muscle and Leg Pain . Stabilizes the Pelvic Ring . Offers a Cushioned Abdominal Lift Pad . Relieves pressure on the Sciatic Nerve Within Minutes WHERE TO GET YOUR PREGNANCY BELT:  [employer]  [address] [place], [place] [address]  [phone]  [email or web address]    Meds:  Meds ordered this encounter  Medications  . Elastic Bandages & Supports (COMFORT FIT MATERNITY SUPP SM) MISC    Sig: 1 each by Does not apply route daily as needed. Wear belt during the day, as needed and remove at night prior to bedtime. ICD-10 code:    Dispense:  1 each    Refill:  0   Labs/procedures today: 2 hr GTT and 3rd trimester labs  Plan:  Continue routine obstetrical care   Reviewed: Preterm labor symptoms and general obstetric precautions including but not limited to vaginal bleeding, contractions, leaking of fluid and fetal movement were reviewed in detail with the patient.  All questions were answered. Has home bp cuff. Check bp weekly, let us know if >140/90.   Follow-up: Return in about 4 weeks (around 08/15/2020) for Return OB - My Chart video.  Orders Placed This Encounter  Procedures  . HIV Antibody (routine testing w rflx)  . RPR  . CBC  . Glucose Tolerance, 2 Hours w/1 Hour   [name] MSN, CNM 07/18/2020 8:52 AM

## 2020-07-18 NOTE — Patient Instructions (Addendum)
https://www.cdc.gov/vaccines/hcp/vis/vis-statements/tdap.pdf">  Tdap (Tetanus, Diphtheria, Pertussis) Vaccine: What You Need to Know 1. Why get vaccinated? Tdap vaccine can prevent tetanus, diphtheria, and pertussis. Diphtheria and pertussis spread from person to person. Tetanus enters the body through cuts or wounds.  TETANUS (T) causes painful stiffening of the muscles. Tetanus can lead to serious health problems, including being unable to open the mouth, having trouble swallowing and breathing, or death.  DIPHTHERIA (D) can lead to difficulty breathing, heart failure, paralysis, or death.  PERTUSSIS (aP), also known as "whooping cough," can cause uncontrollable, violent coughing which makes it hard to breathe, eat, or drink. Pertussis can be extremely serious in babies and young children, causing pneumonia, convulsions, brain damage, or death. In teens and adults, it can cause weight loss, loss of bladder control, passing out, and rib fractures from severe coughing. 2. Tdap vaccine Tdap is only for children 7 years and older, adolescents, and adults.  Adolescents should receive a single dose of Tdap, preferably at age 58 or 40 years. Pregnant women should get a dose of Tdap during every pregnancy, to protect the newborn from pertussis. Infants are most at risk for severe, life-threatening complications from pertussis. Adults who have never received Tdap should get a dose of Tdap. Also, adults should receive a booster dose every 10 years, or earlier in the case of a severe and dirty wound or burn. Booster doses can be either Tdap or Td (a different vaccine that protects against tetanus and diphtheria but not pertussis). Tdap may be given at the same time as other vaccines. 3. Talk with your health care provider Tell your vaccine provider if the person getting the vaccine:  Has had an allergic reaction after a previous dose of any vaccine that protects against tetanus, diphtheria, or  pertussis, or has any severe, life-threatening allergies.  Has had a coma, decreased level of consciousness, or prolonged seizures within 7 days after a previous dose of any pertussis vaccine (DTP, DTaP, or Tdap).  Has seizures or another nervous system problem.  Has ever had Guillain-Barr Syndrome (also called GBS).  Has had severe pain or swelling after a previous dose of any vaccine that protects against tetanus or diphtheria. In some cases, your health care provider may decide to postpone Tdap vaccination to a future visit.  People with minor illnesses, such as a cold, may be vaccinated. People who are moderately or severely ill should usually wait until they recover before getting Tdap vaccine.  Your health care provider can give you more information. 4. Risks of a vaccine reaction  Pain, redness, or swelling where the shot was given, mild fever, headache, feeling tired, and nausea, vomiting, diarrhea, or stomachache sometimes happen after Tdap vaccine. People sometimes faint after medical procedures, including vaccination. Tell your provider if you feel dizzy or have vision changes or ringing in the ears.  As with any medicine, there is a very remote chance of a vaccine causing a severe allergic reaction, other serious injury, or death. 5. What if there is a serious problem? An allergic reaction could occur after the vaccinated person leaves the clinic. If you see signs of a severe allergic reaction (hives, swelling of the face and throat, difficulty breathing, a fast heartbeat, dizziness, or weakness), call 9-1-1 and get the person to the nearest hospital. For other signs that concern you, call your health care provider.  Adverse reactions should be reported to the Vaccine Adverse Event Reporting System (VAERS). Your health care provider will usually file this  report, or you can do it yourself. Visit the VAERS website at www.vaers.LAgents.no or call 2343549533. VAERS is only for  reporting reactions, and VAERS staff do not give medical advice. 6. The National Vaccine Injury Compensation Program The [employer] Vaccine Injury Compensation Program (VICP) is a federal program that was created to compensate people who may have been injured by certain vaccines. Visit the VICP website at SpiritualWord.at or call 813-022-4729 to learn about the program and about filing a claim. There is a time limit to file a claim for compensation. 7. How can I learn more?  Ask your health care provider.  Call your local or state health department.  Contact the Centers for Disease Control and Prevention (CDC): ? Call 904-833-5278 (1-800-CDC-INFO) or ? Visit CDC's website at PicCapture.uy Vaccine Information Statement Tdap (Tetanus, Diphtheria, Pertussis) Vaccine (02/15/2019) This information is not intended to replace advice given to you by your health care provider. Make sure you discuss any questions you have with your health care provider. Document Revised: 02/24/2019 Document Reviewed: 02/27/2019 Elsevier Patient Education  2020 Elsevier Inc.  Back Pain in Pregnancy Back pain during pregnancy is common. Back pain may be caused by several factors that are related to changes during your pregnancy. Follow these instructions at home: Managing pain, stiffness, and swelling      If directed, for sudden (acute) back pain, put ice on the painful area. ? Put ice in a plastic bag. ? Place a towel between your skin and the bag. ? Leave the ice on for 20 minutes, 2-3 times per day.  If directed, apply heat to the affected area before you exercise. Use the heat source that your health care provider recommends, such as a moist heat pack or a heating pad. ? Place a towel between your skin and the heat source. ? Leave the heat on for 20-30 minutes. ? Remove the heat if your skin turns bright red. This is especially important if you are unable to feel pain, heat, or  cold. You may have a greater risk of getting burned.  If directed, massage the affected area. Activity  Exercise as told by your health care provider. Gentle exercise is the best way to prevent or manage back pain.  Listen to your body when lifting. If lifting hurts, ask for help or bend your knees. This uses your leg muscles instead of your back muscles.  Squat down when picking up something from the floor. Do not bend over.  Only use bed rest for short periods as told by your health care provider. Bed rest should only be used for the most severe episodes of back pain. Standing, sitting, and lying down  Do not stand in one place for long periods of time.  Use good posture when sitting. Make sure your head rests over your shoulders and is not hanging forward. Use a pillow on your lower back if necessary.  Try sleeping on your side, preferably the left side, with a pregnancy support pillow or 1-2 regular pillows between your legs. ? If you have back pain after a night's rest, your bed may be too soft. ? A firm mattress may provide more support for your back during pregnancy. General instructions  Do not wear high heels.  Eat a healthy diet. Try to gain weight within your health care provider's recommendations.  Use a maternity girdle, elastic sling, or back brace as told by your health care provider.  Take over-the-counter and prescription medicines only as told by your  health care provider.  Work with a physical therapist or massage therapist to find ways to manage back pain. Acupuncture or massage therapy may be helpful.  Keep all follow-up visits as told by your health care provider. This is important. Contact a health care provider if:  Your back pain interferes with your daily activities.  You have increasing pain in other parts of your body. Get help right away if:  You develop numbness, tingling, weakness, or problems with the use of your arms or legs.  You develop  severe back pain that is not controlled with medicine.  You have a change in bowel or bladder control.  You develop shortness of breath, dizziness, or you faint.  You develop nausea, vomiting, or sweating.  You have back pain that is a rhythmic, cramping pain similar to labor pains. Labor pain is usually 1-2 minutes apart, lasts for about 1 minute, and involves a bearing down feeling or pressure in your pelvis.  You have back pain and your water breaks or you have vaginal bleeding.  You have back pain or numbness that travels down your leg.  Your back pain developed after you fell.  You develop pain on one side of your back.  You see blood in your urine.  You develop skin blisters in the area of your back pain. Summary  Back pain may be caused by several factors that are related to changes during your pregnancy.  Follow instructions as told by your health care provider for managing pain, stiffness, and swelling.  Exercise as told by your health care provider. Gentle exercise is the best way to prevent or manage back pain.  Take over-the-counter and prescription medicines only as told by your health care provider.  Keep all follow-up visits as told by your health care provider. This is important. This information is not intended to replace advice given to you by your health care provider. Make sure you discuss any questions you have with your health care provider. Document Revised: 02/21/2019 Document Reviewed: 04/20/2018 Elsevier Patient Education  2020 Elsevier Inc.   Iron-Rich Diet  Iron is a mineral that helps your body to produce hemoglobin. Hemoglobin is a protein in red blood cells that carries oxygen to your body's tissues. Eating too little iron may cause you to feel weak and tired, and it can increase your risk of infection. Iron is naturally found in many foods, and many foods have iron added to them (iron-fortified foods). You may need to follow an iron-rich diet  if you do not have enough iron in your body due to certain medical conditions. The amount of iron that you need each day depends on your age, your sex, and any medical conditions you have. Follow instructions from your health care provider or a diet and nutrition specialist (dietitian) about how much iron you should eat each day. What are tips for following this plan? Reading food labels  Check food labels to see how many milligrams (mg) of iron are in each serving. Cooking  Cook foods in pots and pans that are made from iron.  Take these steps to make it easier for your body to absorb iron from certain foods: ? Soak beans overnight before cooking. ? Soak whole grains overnight and drain them before using. ? Ferment flours before baking, such as by using yeast in bread dough. Meal planning  When you eat foods that contain iron, you should eat them with foods that are high in vitamin C. These include  oranges, peppers, tomatoes, potatoes, and mango. Vitamin C helps your body to absorb iron. General information  Take iron supplements only as told by your health care provider. An overdose of iron can be life-threatening. If you were prescribed iron supplements, take them with orange juice or a vitamin C supplement.  When you eat iron-fortified foods or take an iron supplement, you should also eat foods that naturally contain iron, such as meat, poultry, and fish. Eating naturally iron-rich foods helps your body to absorb the iron that is added to other foods or contained in a supplement.  Certain foods and drinks prevent your body from absorbing iron properly. Avoid eating these foods in the same meal as iron-rich foods or with iron supplements. These foods include: ? Coffee, black tea, and red wine. ? Milk, dairy products, and foods that are high in calcium. ? Beans and soybeans. ? Whole grains. What foods should I eat? Fruits Prunes. Raisins. Eat fruits high in vitamin C, such as oranges,  grapefruits, and strawberries, alongside iron-rich foods. Vegetables Spinach (cooked). Green peas. Broccoli. Fermented vegetables. Eat vegetables high in vitamin C, such as leafy greens, potatoes, bell peppers, and tomatoes, alongside iron-rich foods. Grains Iron-fortified breakfast cereal. Iron-fortified whole-wheat bread. Enriched rice. Sprouted grains. Meats and other proteins Beef liver. Oysters. Beef. Shrimp. [place]. Chicken. Tuna. Sardines. Chickpeas. Nuts. Tofu. Pumpkin seeds. Beverages Tomato juice. Fresh orange juice. Prune juice. Hibiscus tea. Fortified instant breakfast shakes. Sweets and desserts Blackstrap molasses. Seasonings and condiments Tahini. Fermented soy sauce. Other foods Wheat germ. The items listed above may not be a complete list of recommended foods and beverages. Contact a dietitian for more information. What foods should I avoid? Grains Whole grains. Bran cereal. Bran flour. Oats. Meats and other proteins Soybeans. Products made from soy protein. Black beans. Lentils. Mung beans. Split peas. Dairy Milk. Cream. Cheese. Yogurt. Cottage cheese. Beverages Coffee. Black tea. Red wine. Sweets and desserts Cocoa. Chocolate. Ice cream. Other foods Basil. Oregano. Large amounts of parsley. The items listed above may not be a complete list of foods and beverages to avoid. Contact a dietitian for more information. Summary  Iron is a mineral that helps your body to produce hemoglobin. Hemoglobin is a protein in red blood cells that carries oxygen to your body's tissues.  Iron is naturally found in many foods, and many foods have iron added to them (iron-fortified foods).  When you eat foods that contain iron, you should eat them with foods that are high in vitamin C. Vitamin C helps your body to absorb iron.  Certain foods and drinks prevent your body from absorbing iron properly, such as whole grains and dairy products. You should avoid eating these foods in  the same meal as iron-rich foods or with iron supplements. This information is not intended to replace advice given to you by your health care provider. Make sure you discuss any questions you have with your health care provider. Document Revised: 10/15/2017 Document Reviewed: 09/28/2017 Elsevier Patient Education  2020 Elsevier Inc.    Fetal Movement Counts Patient Name: ________________________________________________ Patient Due Date: ____________________ What is a fetal movement count?  A fetal movement count is the number of times that you feel your baby move during a certain amount of time. This may also be called a fetal kick count. A fetal movement count is recommended for every pregnant woman. You may be asked to start counting fetal movements as early as week 28 of your pregnancy. Pay attention to when your baby  is most active. You may notice your baby's sleep and wake cycles. You may also notice things that make your baby move more. You should do a fetal movement count:  When your baby is normally most active.  At the same time each day. A good time to count movements is while you are resting, after having something to eat and drink. How do I count fetal movements? 1. Find a quiet, comfortable area. Sit, or lie down on your side. 2. Write down the date, the start time and stop time, and the number of movements that you felt between those two times. Take this information with you to your health care visits. 3. Write down your start time when you feel the first movement. 4. Count kicks, flutters, swishes, rolls, and jabs. You should feel at least 10 movements. 5. You may stop counting after you have felt 10 movements, or if you have been counting for 2 hours. Write down the stop time. 6. If you do not feel 10 movements in 2 hours, contact your health care provider for further instructions. Your health care provider may want to do additional tests to assess your baby's  well-being. Contact a health care provider if:  You feel fewer than 10 movements in 2 hours.  Your baby is not moving like he or she usually does. Date: ____________ Start time: ____________ Stop time: ____________ Movements: ____________ Date: ____________ Start time: ____________ Stop time: ____________ Movements: ____________ Date: ____________ Start time: ____________ Stop time: ____________ Movements: ____________ Date: ____________ Start time: ____________ Stop time: ____________ Movements: ____________ Date: ____________ Start time: ____________ Stop time: ____________ Movements: ____________ Date: ____________ Start time: ____________ Stop time: ____________ Movements: ____________ Date: ____________ Start time: ____________ Stop time: ____________ Movements: ____________ Date: ____________ Start time: ____________ Stop time: ____________ Movements: ____________ Date: ____________ Start time: ____________ Stop time: ____________ Movements: ____________ This information is not intended to replace advice given to you by your health care provider. Make sure you discuss any questions you have with your health care provider. Document Revised: 06/22/2019 Document Reviewed: 06/22/2019 Elsevier Patient Education  2020 [employer].

## 2020-07-19 ENCOUNTER — Ambulatory Visit: Payer: Medicaid Other | Attending: Obstetrics and Gynecology

## 2020-07-19 ENCOUNTER — Other Ambulatory Visit: Payer: Self-pay | Admitting: *Deleted

## 2020-07-19 ENCOUNTER — Encounter: Payer: Self-pay | Admitting: *Deleted

## 2020-07-19 ENCOUNTER — Ambulatory Visit: Payer: Medicaid Other | Admitting: *Deleted

## 2020-07-19 DIAGNOSIS — Z148 Genetic carrier of other disease: Secondary | ICD-10-CM | POA: Diagnosis not present

## 2020-07-19 DIAGNOSIS — O43193 Other malformation of placenta, third trimester: Secondary | ICD-10-CM

## 2020-07-19 DIAGNOSIS — Z3400 Encounter for supervision of normal first pregnancy, unspecified trimester: Secondary | ICD-10-CM

## 2020-07-19 DIAGNOSIS — Z363 Encounter for antenatal screening for malformations: Secondary | ICD-10-CM

## 2020-07-19 DIAGNOSIS — O368990 Maternal care for other specified fetal problems, unspecified trimester, not applicable or unspecified: Secondary | ICD-10-CM

## 2020-07-19 DIAGNOSIS — O0932 Supervision of pregnancy with insufficient antenatal care, second trimester: Secondary | ICD-10-CM

## 2020-07-19 DIAGNOSIS — Z3A28 28 weeks gestation of pregnancy: Secondary | ICD-10-CM | POA: Diagnosis not present

## 2020-07-19 LAB — CBC
Hematocrit: 40.1 % (ref 34.0–46.6)
Hemoglobin: 13.7 g/dL (ref 11.1–15.9)
MCH: 33 pg (ref 26.6–33.0)
MCHC: 34.2 g/dL (ref 31.5–35.7)
MCV: 97 fL (ref 79–97)
Platelets: 288 10*3/uL (ref 150–450)
RBC: 4.15 x10E6/uL (ref 3.77–5.28)
RDW: 12.3 % (ref 11.7–15.4)
WBC: 12.8 10*3/uL — ABNORMAL HIGH (ref 3.4–10.8)

## 2020-07-19 LAB — GLUCOSE TOLERANCE, 2 HOURS W/ 1HR
Glucose, 1 hour: 134 mg/dL (ref 65–179)
Glucose, 2 hour: 79 mg/dL (ref 65–152)
Glucose, Fasting: 65 mg/dL (ref 65–91)

## 2020-07-19 LAB — RPR: RPR Ser Ql: NONREACTIVE

## 2020-07-19 LAB — HIV ANTIBODY (ROUTINE TESTING W REFLEX): HIV Screen 4th Generation wRfx: NONREACTIVE

## 2020-08-15 ENCOUNTER — Encounter: Payer: Self-pay | Admitting: Medical

## 2020-08-15 ENCOUNTER — Ambulatory Visit (INDEPENDENT_AMBULATORY_CARE_PROVIDER_SITE_OTHER): Payer: Medicaid Other | Admitting: Medical

## 2020-08-15 ENCOUNTER — Other Ambulatory Visit (HOSPITAL_COMMUNITY)
Admission: RE | Admit: 2020-08-15 | Discharge: 2020-08-15 | Disposition: A | Discharge status: Home / Self Care | Payer: Medicaid Other | Source: Ambulatory Visit | Attending: Medical | Admitting: Medical

## 2020-08-15 ENCOUNTER — Other Ambulatory Visit: Payer: Self-pay

## 2020-08-15 VITALS — BP 120/66 | HR 75 | Temp 97.5°F | Wt 151.0 lb

## 2020-08-15 DIAGNOSIS — Z23 Encounter for immunization: Secondary | ICD-10-CM

## 2020-08-15 DIAGNOSIS — Z3400 Encounter for supervision of normal first pregnancy, unspecified trimester: Secondary | ICD-10-CM | POA: Diagnosis not present

## 2020-08-15 DIAGNOSIS — O26899 Other specified pregnancy related conditions, unspecified trimester: Secondary | ICD-10-CM | POA: Diagnosis present

## 2020-08-15 DIAGNOSIS — B3731 Acute candidiasis of vulva and vagina: Secondary | ICD-10-CM

## 2020-08-15 DIAGNOSIS — B373 Candidiasis of vulva and vagina: Secondary | ICD-10-CM

## 2020-08-15 DIAGNOSIS — N898 Other specified noninflammatory disorders of vagina: Secondary | ICD-10-CM | POA: Diagnosis present

## 2020-08-15 DIAGNOSIS — O285 Abnormal chromosomal and genetic finding on antenatal screening of mother: Secondary | ICD-10-CM

## 2020-08-15 DIAGNOSIS — Z3A32 32 weeks gestation of pregnancy: Secondary | ICD-10-CM

## 2020-08-15 MED ORDER — NYSTATIN 100000 UNIT/GM EX CREA: TOPICAL_CREAM | CUTANEOUS | 0 refills | Status: DC

## 2020-08-15 MED ORDER — TRIAMCINOLONE ACETONIDE 0.1 % EX CREA: 1.0000 "application " | TOPICAL_CREAM | Freq: Two times a day (BID) | CUTANEOUS | 0 refills | Status: DC

## 2020-08-15 NOTE — Progress Notes (Signed)
   PRENATAL VISIT NOTE  Subjective:  Alyssa Mercado is a 19 y.o. G3P0020 at [redacted]w[redacted]d being seen today for ongoing prenatal care.  She is currently monitored for the following issues for this low-risk pregnancy and has Supervision of normal first pregnancy, antepartum; Abnormal genetic test during pregnancy; Gastroesophageal reflux during pregnancy in second trimester, antepartum; and Morning sickness on their problem list.  Patient reports vaginal irritation and vaginal discharge x 1 week. White discharge without odor. Denies bleeding. + itching.  Contractions: Not present. Vag. Bleeding: None.  Movement: Present. Denies leaking of fluid.   The following portions of the patient's history were reviewed and updated as appropriate: allergies, current medications, past family history, past medical history, past social history, past surgical history and problem list.   Objective:   Vitals:   08/15/20 1100  BP: 120/66  Pulse: 75  Temp: (!) 97.5 F (36.4 C)  Weight: 151 lb (68.5 kg)    Fetal Status: Fetal Heart Rate (bpm): 135   Movement: Present     General:  Alert, oriented and cooperative. Patient is in no acute distress.  Skin: Skin is warm and dry. No rash noted.   Cardiovascular: Normal heart rate noted  Respiratory: Normal respiratory effort, no problems with respiration noted  Abdomen: Soft, gravid, appropriate for gestational age.  Pain/Pressure: Present     Pelvic: Cervical exam deferred       Small thin, white discharge noted without odor. Cervix without lesions. No bleeding. Labia minor and lower labia majora are erythematous.   Extremities: Normal range of motion.  Edema: None  Mental Status: Normal mood and affect. Normal behavior. Normal judgment and thought content.   Assessment and Plan:  Pregnancy: G3P0020 at [redacted]w[redacted]d 1. Vaginal discharge during pregnancy, antepartum - Cervicovaginal ancillary only( [facility])  2. Vaginal irritation - nystatin cream  (MYCOSTATIN); Apply to affected area 2 times daily  Dispense: 15 g; Refill: 0 - triamcinolone cream (KENALOG) 0.1 %; Apply 1 application topically 2 (two) times daily.  Dispense: 30 g; Refill: 0  3. Supervision of normal first pregnancy, antepartum - Tdap vaccine greater than or equal to 7yo IM - Doing well - Peds list given  - Considering PP IUD for [facility] - Unsure about circumcision at this point  - Follow-up US on 10/15  4. 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 Need for tetanus, diphtheria, and acellular pertussis (Tdap) vaccine in patient of adolescent age or older  Preterm labor symptoms and general obstetric precautions including but not limited to vaginal bleeding, contractions, leaking of fluid and fetal movement were reviewed in detail with the patient. Please refer to After Visit Summary for other counseling recommendations.   No follow-ups on file.  Future Appointments  Date Time Provider Department Center  08/29/2020  3:50 PM [name], CNM [facility] None  08/30/2020 12:45 PM [facility]  09/12/2020  9:30 AM [name], CNM [facility] None  09/19/2020  9:30 AM [name], CNM [facility] None  09/26/2020  9:30 AM [name], CNM [facility] None    Vonzella Nipple, PA-C

## 2020-08-15 NOTE — Patient Instructions (Signed)
Fetal Movement Counts °Patient Name: ________________________________________________ Patient Due Date: ____________________ °What is a fetal movement count? ° °A fetal movement count is the number of times that you feel your baby move during a certain amount of time. This may also be called a fetal kick count. A fetal movement count is recommended for every pregnant woman. You may be asked to start counting fetal movements as early as week 28 of your pregnancy. °Pay attention to when your baby is most active. You may notice your baby's sleep and wake cycles. You may also notice things that make your baby move more. You should do a fetal movement count: °· When your baby is normally most active. °· At the same time each day. °A good time to count movements is while you are resting, after having something to eat and drink. °How do I count fetal movements? °1. Find a quiet, comfortable area. Sit, or lie down on your side. °2. Write down the date, the start time and stop time, and the number of movements that you felt between those two times. Take this information with you to your health care visits. °3. Write down your start time when you feel the first movement. °4. Count kicks, flutters, swishes, rolls, and jabs. You should feel at least 10 movements. °5. You may stop counting after you have felt 10 movements, or if you have been counting for 2 hours. Write down the stop time. °6. If you do not feel 10 movements in 2 hours, contact your health care provider for further instructions. Your health care provider may want to do additional tests to assess your baby's well-being. °Contact a health care provider if: °· You feel fewer than 10 movements in 2 hours. °· Your baby is not moving like he or she usually does. °Date: ____________ Start time: ____________ Stop time: ____________ Movements: ____________ °Date: ____________ Start time: ____________ Stop time: ____________ Movements: ____________ °Date: ____________  Start time: ____________ Stop time: ____________ Movements: ____________ °Date: ____________ Start time: ____________ Stop time: ____________ Movements: ____________ °Date: ____________ Start time: ____________ Stop time: ____________ Movements: ____________ °Date: ____________ Start time: ____________ Stop time: ____________ Movements: ____________ °Date: ____________ Start time: ____________ Stop time: ____________ Movements: ____________ °Date: ____________ Start time: ____________ Stop time: ____________ Movements: ____________ °Date: ____________ Start time: ____________ Stop time: ____________ Movements: ____________ °This information is not intended to replace advice given to you by your health care provider. Make sure you discuss any questions you have with your health care provider. °Document Revised: 06/22/2019 Document Reviewed: 06/22/2019 °Elsevier Patient Education © 2020 Elsevier Inc. °Braxton Hicks Contractions °Contractions of the uterus can occur throughout pregnancy, but they are not always a sign that you are in labor. You may have practice contractions called Braxton Hicks contractions. These false labor contractions are sometimes confused with true labor. °What are Braxton Hicks contractions? °Braxton Hicks contractions are tightening movements that occur in the muscles of the uterus before labor. Unlike true labor contractions, these contractions do not result in opening (dilation) and thinning of the cervix. Toward the end of pregnancy (32-34 weeks), Braxton Hicks contractions can happen more often and may become stronger. These contractions are sometimes difficult to tell apart from true labor because they can be very uncomfortable. You should not feel embarrassed if you go to the hospital with false labor. °Sometimes, the only way to tell if you are in true labor is for your health care provider to look for changes in the cervix. The health care provider   will do a physical exam and may  monitor your contractions. If you are not in true labor, the exam should show that your cervix is not dilating and your water has not broken. °If there are no other health problems associated with your pregnancy, it is completely safe for you to be sent home with false labor. You may continue to have Braxton Hicks contractions until you go into true labor. °How to tell the difference between true labor and false labor °True labor °· Contractions last 30-70 seconds. °· Contractions become very regular. °· Discomfort is usually felt in the top of the uterus, and it spreads to the lower abdomen and low back. °· Contractions do not go away with walking. °· Contractions usually become more intense and increase in frequency. °· The cervix dilates and gets thinner. °False labor °· Contractions are usually shorter and not as strong as true labor contractions. °· Contractions are usually irregular. °· Contractions are often felt in the front of the lower abdomen and in the groin. °· Contractions may go away when you walk around or change positions while lying down. °· Contractions get weaker and are shorter-lasting as time goes on. °· The cervix usually does not dilate or become thin. °Follow these instructions at home: ° °· Take over-the-counter and prescription medicines only as told by your health care provider. °· Keep up with your usual exercises and follow other instructions from your health care provider. °· Eat and drink lightly if you think you are going into labor. °· If Braxton Hicks contractions are making you uncomfortable: °? Change your position from lying down or resting to walking, or change from walking to resting. °? Sit and rest in a tub of warm water. °? Drink enough fluid to keep your urine pale yellow. Dehydration may cause these contractions. °? Do slow and deep breathing several times an hour. °· Keep all follow-up prenatal visits as told by your health care provider. This is important. °Contact a  health care provider if: °· You have a fever. °· You have continuous pain in your abdomen. °Get help right away if: °· Your contractions become stronger, more regular, and closer together. °· You have fluid leaking or gushing from your vagina. °· You pass blood-tinged mucus (bloody show). °· You have bleeding from your vagina. °· You have low back pain that you never had before. °· You feel your baby’s head pushing down and causing pelvic pressure. °· Your baby is not moving inside you as much as it used to. °Summary °· Contractions that occur before labor are called Braxton Hicks contractions, false labor, or practice contractions. °· Braxton Hicks contractions are usually shorter, weaker, farther apart, and less regular than true labor contractions. True labor contractions usually become progressively stronger and regular, and they become more frequent. °· Manage discomfort from Braxton Hicks contractions by changing position, resting in a warm bath, drinking plenty of water, or practicing deep breathing. °This information is not intended to replace advice given to you by your health care provider. Make sure you discuss any questions you have with your health care provider. °Document Revised: 10/15/2017 Document Reviewed: 03/18/2017 °Elsevier Patient Education © 2020 Elsevier Inc. ° °AREA PEDIATRIC/FAMILY PRACTICE PHYSICIANS ° °[place] ([address]) °• [facility] °o Chambliss, MD; Eniola, MD; Hale, MD; Hensel, MD; McDiarmid, MD; McIntyer, MD; Neal, MD; Walden, MD °o [address] [place], [place] [address] °o [phone] °o Mon-Fri 8:30-12:30, 1:30-5:00 °o Providers come to see babies   at [facility] °o Accepting Medicaid °• [facility] °o Limited providers who accept newborns: Koirala, MD; Morrow, MD; Wolters, MD °o [address], [place], [place] [address] °o [phone] °o Mon-Fri 8:00-5:30 °o Babies seen by providers at  [facility] °o Does NOT accept Medicaid °o Please call early in hospitalization for appointment (limited availability)  °• [facility] °o Mulberry, MD °o [address] [place], [place] [address] °o [phone] °o Mon, Tue, Thur, Fri 8:30-5:00, Wed 10:00-7:00 (closed 1-2pm) °o Babies seen by [facility] providers °o Accepting Medicaid °• [facility] °o Rubin, MD °o [address], [place], [place] [address] °o [phone] °o Mon-Fri 8:30-5:00, Sat 8:30-12:00 °o Provider comes to see babies at [facility] °o Accepting Medicaid °o Must have been referred from current patients or contacted office prior to delivery °• [facility] ([facility]) °o Brown, MD; Chandler, MD; Ettefagh, MD; Grant, MD; Lester, MD; McCormick, MD; McQueen, MD; Prose, MD; Simha, MD; Stanley, MD; Stryffeler, NP; Tebben, NP °o [address], [place], [place] [address] °o [phone] °o Mon, Tue, Thur, Fri 8:30-5:30, Wed 9:30-5:30, Sat 8:30-12:30 °o Babies seen by [facility] providers °o Accepting Medicaid °o Only accepting infants of first-time parents or siblings of current patients °o Hospital discharge coordinator will make follow-up appointment °• Jack Amos °o [address], [place], [place]  [address] °o [phone]   Fax - [phone] °• [facility] °o [address], [place], [place]  [address] °o Phone - [phone]   Fax - [phone] °• Shilpa Gosrani °o [address], [place], [place]  [address] °o [phone] ° °[place] ([address]) °• [facility] °o Bates, MD; Brassfield, MD; Cooper, Cox, MD; MD; Davis, MD; Dovico, MD; Ettefaugh, MD; Little, MD; Lowe, MD; Keiffer, MD; Melvin, MD; Sumner, MD; Williams, MD °o [address], [place], [place] [address] °o [phone] °o Mon-Fri 8:30-5:00 (extended evenings Mon-Thur as needed), Sat-Sun  10:00-1:00 °o Providers come to see babies at [facility] °o Accepting Medicaid for families of first-time babies and families with all children in the household age 3 and under. Must register with office prior to making appointment (M-F only). °• [facility] °o Henson, NP; Knapp, MD; Lalonde, MD; Tysinger, PA °o [address] [place], [place] [address] °o [phone] °o Mon-Fri 8:00-5:00 °o Babies seen by providers at [facility] °o Does NOT accept Medicaid/Commercial Insurance Only °• [facility] ([facility]  °o Artis, MD; Barnes, MD; Bratton, MD; Coccaro, MD; Lockett Gardner, MD; Kramer, MD; Marshall, MD; Netherton, MD; Poleto, MD; Skinner, MD °o [address] [place], [place] [address] °o [phone] °o Mon-Fri 8:30-5:30, Sat (Oct.-Mar.) 9:00-1:00 °o Babies seen by providers at [facility] °o Accepting Medicaid ° °[place] ([address]) °• [facility] °o Reid, MD; Warner, MD °o [address], [place], [place] [address] °o [phone] °o Mon-Fri 8:30-5:00, Sat 8:30-12:00 °o Providers come to see babies at [facility] °o Does NOT accept Medicaid °• [facility] °o Becker, PA; Hagler, MD; Scifres, PA; Sun, MD; Swayne, MD °o [address], ,  [address] °o [phone] °o Mon-Fri 8:00-5:00 °o Babies seen by providers at [facility] °o Does NOT accept Medicaid °o Only accepting babies of parents who are patients °o Please call   early in hospitalization for appointment (limited availability) °• [facility] °o Clark, MD; Frye, MD; Kelleher, MD; Mack, NP; Miller, MD; O'Keller, MD; Patterson, NP; Pudlo, MD; Puzio, MD; Thomas, MD; Tucker, MD; Twiselton, MD °o [address], [place], [place] [address] °o [phone] °o Mon-Fri 8:00-5:00, Sat 9:00-12:00 °o Providers come to see babies at [facility] °o Does NOT accept  Medicaid ° °[place] ([address]) °• [facility] °o Limited providers accepting new patients: Brake, NP; Wharton, PA °o [address], [place], [place] [address] °o [phone] °o Mon-Fri 8:00-5:00 °o Babies seen by providers at [facility] °o Does NOT accept Medicaid °o Only accepting babies of parents who are patients °o Please call early in hospitalization for appointment (limited availability) °• [facility] °o Gay, MD; Quinlan, MD °o [address] [place], [place] [address] °o [phone] (press 1 to schedule appointment) °o Mon-Fri 8:00-5:00 °o Providers come to see babies at [facility] °o Does NOT accept Medicaid °• [facility] °o Mazer, MD °o [address] [place], [place] [address] °o [phone] °o Mon-Fri 8:30-5:00 (lunch 12:30-1:00), extended hours by appointment only Wed 5:00-6:30 °o Babies seen by [facility] providers °o Accepting Medicaid °• [facility] °o Banks, MD; Jordan, MD; Koberlein, MD °o [address], [place], [place] [address] °o [phone] °o Mon-Fri 8:00-5:00 °o Babies seen by [facility] providers °o Does NOT accept Medicaid °• [facility] °o Parker, MD; Hunter, MD; Wallace, DO °o [address] [place], [place] [address] °o [phone] °o Mon-Fri 8:00-5:00 °o Babies seen by [facility] providers °o Does NOT accept Medicaid °• [facility] °o Brandon, PA; Brecken, PA; Christy, NP; Dees, MD; DeClaire, MD; DeWeese, MD; Hansen, NP; Mills, NP; Parrish, NP; Smoot, NP; Summer, MD; Vapne, MD °o [address] [place], [place] [address] °o [phone] °o Mon-Fri 8:30-5:00, Sat 10:00-1:00 °o Providers come to see babies at [facility] °o Does NOT accept Medicaid °o Free prenatal information session Tuesdays at 4:45pm °• [facility] °o Bouska, MD; Gordon, PA; Jeffery, PA; Weber, PA °o [address] [place] [address] °o [phone] °o Mon-Fri 7:30-5:30 °o Babies seen by [facility] providers °• [facility] °o [address], [place], [place]  [address] °o [phone]   Fax - [phone] ° °[place] ([address]) °• [facility] °o Reese, MD °o [address] [place], [place] [address] °o [phone] °o Mon-Thur 8:00-6:00 °o Providers come to see babies at [facility] °o Accepting Medicaid °• [facility] °o Anderson, NP; Badger, MD; Beal, PA; Spencer, PA °o [address] [place], [place] [address] °o [phone] °o Mon-Thur 7:30-7:30, Fri 7:30-4:30 °o Babies seen by [facility] providers °o Accepting Medicaid °• [facility] °o Agbuya, MD; Klett, NP; Romgoolam, MD °o [address], [place], [place] [address] °o [phone] °o Mon-Fri 8:30-5:00, Sat 8:30-12:00 °o Providers come to see babies at [facility] °o Accepting Medicaid °o Must have “Meet & Greet” appointment at office prior to delivery °• [facility] ([facility] °o McCord, MD; Wallace, MD; Wood, MD °o [address], [place], [place] [address] °o [phone] °o Mon-Wed 8:00-6:00, Thur-Fri 8:00-5:00, Sat 9:00-12:00 °o Providers come to see babies at [facility] °o Does NOT accept Medicaid °o Only accepting siblings of current patients °• [facility]   °  o [address], [place], [place]  [address] °o [phone]   Fax - [phone] °• [facility] °o [address], [place], [place]  [address] °o [phone]   Fax - [phone] ° °[place] ([address]) °• [facility] °o Cirigliano, DO; Matthews, DO °o [address] [place], [place] [address] °o [phone] °o Mon-Fri 7:00-5:00 °o Babies seen by [facility] providers °o Does NOT  accept Medicaid °• [facility] °o Briscoe, MD; Howley, PA; Moreira, PA °o [address], [place], [place] [address] °o [phone] °o Mon-Fri 8:00-5:00 °o Babies seen by [facility] providers °o Accepting Medicaid °• [facility] °o Boyd, MD; Church, PA; Jones, NP; Osborn, PA °o [address], , [place] [address] °o [phone] °o Mon-Fri 8:00-5:00 °o Babies seen by providers at [facility] °o Accepting Medicaid ° °[place] ([address]) °• [facility] [place] °o Wendling, DO °o [address] [place], [place] [address] °o [phone] °o Mon-Fri 8:00-5:00 °o Babies seen by [facility] providers °o Does NOT accept Medicaid °o Limited availability, please call early in hospitalization to schedule follow-up °• [facility] °o Calderon, PA; Cummings, MD; Dillard, MD; Martin, PA; Olson, MD; VanDeven, PA °o [address], [place], [place] [address] °o [phone] °o Mon-Fri 8:30-5:00, Sat 9:00-12:00 °o Babies seen by providers at [facility] °o Accepting Medicaid °o Please register online then schedule online or call office °o [email or web address] °• [facility] ([facility] °o Hunter, NP; Kumar, MD; Martin Rogers, PA °o [address], [place], [place] [address] °o [phone] °o Mon-Fri 8:00-5:00 °o Babies seen by providers at [facility] °o Accepting Medicaid °• [facility] ([facility] °o [place], MD; Kristi Fleenor, NP; West, MD °o [address], [place], [place] [address] °o [phone] °o Mon-Fri 8:00-5:30, Sat&Sun by appointment (phones open at 8:30) °o Babies seen by [facility] providers °o Accepting Medicaid °o Must be a first-time baby or sibling of current patient °• [facility] [place]  °o [address], [place], [place]  [address] °o [phone]   Fax - [phone] ° °[place] ([address]) °• [facility] [place] [facility] °o Brown, PA; Cowen, PA; Rice, MD; Helton, PA; Spry, MD °o [address] [place], [place] [address] °o [phone] °o Mon-Thur 8:00-7:00, Fri 8:00-5:00, Sat 8:00-12:00, Sun 9:00-12:00 °o Babies seen by [facility] providers °o Accepting Medicaid °• [facility] °o Coe-Goins, MD; Marshall, MD; Pierre-Louis, MD °o [address], [place], [place] [address] °o [phone] °o Mon-Thur 8:00-5:00 °o Babies seen by providers at [facility] °o Accepting Medicaid °• [facility] °o Bratton, MD; Coe-Goins, MD; Hayes, MD; Lewis, MD; List, MD; Lott, MD; Marshall, MD; Moran, MD; O'Neal, MD; Pierre-Louis, MD; Pitonzo, MD; Scholer, MD; Spangle, MD °o [address] [place],  [address] °o [phone] °o Mon-Fri 8:00-5:30, Sat (Oct.-Mar.) 9:00-1:00 °o Babies seen by providers at [facility] °o Accepting Medicaid °o Must fill out new patient packet, available online at [email or web address] °• [facility] ([facility]) °o Friddle, NP; Harris, NP; Kelly, NP; [place],   MD; Melvin, PA; [place]oth, MD; Ramadoss, MD; Stanton, NP °o [address], [place], [place] [address] °o [phone] °o Mon-Thur 8:00-5:30, Fri 8:00-5:00 °o Babies seen by providers at [facility] °o Accepting Medicaid ° °[place] ([address]) °• [facility] °o [place], PA; [place], MD; Pickard, MD; Tapia, PA °o [address], [place], [place] [address] °o [phone] °o Mon-Fri 8:00-5:00 °o Babies seen by providers at [facility] °o Accepting Medicaid  ° °[name] ([address]) °• [facility] [name] °o Masneri, DO; Meyers, MD; Nelson, PA °o [address], [name], [place]  [address] °o [phone] °o Mon-Fri 8:00-5:00 °o Babies seen by providers at [facility] °o Does NOT accept Medicaid °o Limited appointment availability, please call early in hospitalization ° °• [facility] [name] °o Kunedd, DO; McGowen, MD °o [address], [name], [place] [address] °o [phone] °o Mon-Fri 8:00-5:00 °o Babies seen by [facility] providers °o Does NOT accept Medicaid °• [facility] [name] °o Cameron, MD; MacDonald, MD; Michaels, PA; Nayak, MD °o [address] [name] [address], [name], [place] [address] °o [phone] °o Mon-Fri 8:00-5:00 °o After hours clinic ([address] [place], [place] [address]) [phone] Mon-Fri 5:00-8:00, Sat 12:00-6:00, Sun 10:00-4:00 °o Babies seen by [facility] providers °o Accepting Medicaid °• [facility] [name] °o [address], [place], [place]  [address] °o [phone]   Fax - [phone] ° °[place] ([address]) °• [facility] [place] °o Andy, MD °o [address], [place], [place] [address] °o [phone] °o Mon-Fri 8:00-5:00 °o Babies seen by [facility] providers °o Does NOT accept Medicaid °• [facility] [place] ([facility] [place]) °o Eksir, MD °o [address], [place], [place] [address] °o [phone] °o Mon-Thur 8:00-7:00, Fri 8:00-5:00, Sat 8:00-12:00 °o Babies seen by providers at [facility] °o Accepting Medicaid - but does not have vaccinations in office (must be received elsewhere) °o Limited availability, please call early in hospitalization ° °[place] ([address]) °• [facility]  °o Charlene Flemming, MD °o [address], [place]  [address] °o [phone]  Fax [phone] ° ° °

## 2020-08-16 ENCOUNTER — Encounter: Payer: Self-pay | Admitting: Medical

## 2020-08-16 LAB — CERVICOVAGINAL ANCILLARY ONLY
Bacterial Vaginitis (gardnerella): NEGATIVE
Candida Glabrata: NEGATIVE
Candida Vaginitis: POSITIVE — AB
Chlamydia: NEGATIVE
Comment: NEGATIVE
Comment: NEGATIVE
Comment: NEGATIVE
Comment: NEGATIVE
Comment: NEGATIVE
Comment: NORMAL
Neisseria Gonorrhea: NEGATIVE
Trichomonas: NEGATIVE

## 2020-08-16 MED ORDER — TERCONAZOLE 0.8 % VA CREA: 1.0000 | TOPICAL_CREAM | Freq: Every day | VAGINAL | 0 refills | Status: DC

## 2020-08-16 NOTE — Addendum Note (Signed)
Addended by: Marny Lowenstein on: 08/16/2020 02:39 PM   Modules accepted: Orders

## 2020-08-29 ENCOUNTER — Encounter: Payer: Medicaid Other | Admitting: Obstetrics and Gynecology

## 2020-08-30 ENCOUNTER — Ambulatory Visit: Payer: Medicaid Other | Attending: Obstetrics and Gynecology

## 2020-08-30 ENCOUNTER — Other Ambulatory Visit: Payer: Self-pay

## 2020-08-30 DIAGNOSIS — O0933 Supervision of pregnancy with insufficient antenatal care, third trimester: Secondary | ICD-10-CM | POA: Diagnosis not present

## 2020-08-30 DIAGNOSIS — Z3A34 34 weeks gestation of pregnancy: Secondary | ICD-10-CM

## 2020-08-30 DIAGNOSIS — Z148 Genetic carrier of other disease: Secondary | ICD-10-CM

## 2020-08-30 DIAGNOSIS — O368990 Maternal care for other specified fetal problems, unspecified trimester, not applicable or unspecified: Secondary | ICD-10-CM | POA: Insufficient documentation

## 2020-09-02 ENCOUNTER — Telehealth: Payer: Self-pay | Admitting: Genetic Counselor

## 2020-09-02 ENCOUNTER — Encounter: Payer: Self-pay | Admitting: Genetic Counselor

## 2020-09-02 NOTE — Telephone Encounter (Signed)
I called Alyssa Mercado to discuss partner carrier screening with Alyssa. Alyssa Mercado informed me that she and Alyssa Mercado were at [facility] and she asked me to call Alyssa back in 15 minutes. When I called Alyssa back, the call went to voicemail. I asked Alyssa to call me back whenever she gets a chance as I will be in my office for the rest of the day.   Gershon Crane, MS, [facility] Genetic Counselor

## 2020-09-07 ENCOUNTER — Inpatient Hospital Stay (HOSPITAL_COMMUNITY): Payer: Medicaid Other | Admitting: Anesthesiology

## 2020-09-07 ENCOUNTER — Other Ambulatory Visit: Payer: Self-pay

## 2020-09-07 ENCOUNTER — Inpatient Hospital Stay (HOSPITAL_COMMUNITY)
Admission: AD | Admit: 2020-09-07 | Discharge: 2020-09-09 | DRG: 807 | Disposition: A | Discharge status: Home / Self Care | Payer: Medicaid Other | Attending: Obstetrics & Gynecology | Admitting: Obstetrics & Gynecology

## 2020-09-07 ENCOUNTER — Encounter (HOSPITAL_COMMUNITY): Payer: Self-pay | Admitting: Family Medicine

## 2020-09-07 DIAGNOSIS — O42913 Preterm premature rupture of membranes, unspecified as to length of time between rupture and onset of labor, third trimester: Secondary | ICD-10-CM | POA: Diagnosis present

## 2020-09-07 DIAGNOSIS — Z3400 Encounter for supervision of normal first pregnancy, unspecified trimester: Secondary | ICD-10-CM

## 2020-09-07 DIAGNOSIS — O42919 Preterm premature rupture of membranes, unspecified as to length of time between rupture and onset of labor, unspecified trimester: Secondary | ICD-10-CM

## 2020-09-07 DIAGNOSIS — Z20822 Contact with and (suspected) exposure to covid-19: Secondary | ICD-10-CM | POA: Diagnosis present

## 2020-09-07 DIAGNOSIS — Z3A35 35 weeks gestation of pregnancy: Secondary | ICD-10-CM

## 2020-09-07 DIAGNOSIS — O328XX0 Maternal care for other malpresentation of fetus, not applicable or unspecified: Secondary | ICD-10-CM

## 2020-09-07 DIAGNOSIS — O26893 Other specified pregnancy related conditions, third trimester: Secondary | ICD-10-CM | POA: Diagnosis present

## 2020-09-07 DIAGNOSIS — O42013 Preterm premature rupture of membranes, onset of labor within 24 hours of rupture, third trimester: Secondary | ICD-10-CM

## 2020-09-07 LAB — RESPIRATORY PANEL BY RT PCR (FLU A&B, COVID)
Influenza A by PCR: NEGATIVE
Influenza B by PCR: NEGATIVE
SARS Coronavirus 2 by RT PCR: NEGATIVE

## 2020-09-07 LAB — CBC
HCT: 40.2 % (ref 36.0–46.0)
Hemoglobin: 13.4 g/dL (ref 12.0–15.0)
MCH: 31.8 pg (ref 26.0–34.0)
MCHC: 33.3 g/dL (ref 30.0–36.0)
MCV: 95.5 fL (ref 80.0–100.0)
Platelets: 326 10*3/uL (ref 150–400)
RBC: 4.21 MIL/uL (ref 3.87–5.11)
RDW: 13.2 % (ref 11.5–15.5)
WBC: 14.2 10*3/uL — ABNORMAL HIGH (ref 4.0–10.5)
nRBC: 0 % (ref 0.0–0.2)

## 2020-09-07 LAB — RAPID URINE DRUG SCREEN, HOSP PERFORMED
Amphetamines: NOT DETECTED
Barbiturates: NOT DETECTED
Benzodiazepines: NOT DETECTED
Cocaine: NOT DETECTED
Opiates: NOT DETECTED
Tetrahydrocannabinol: POSITIVE — AB

## 2020-09-07 LAB — RPR: RPR Ser Ql: NONREACTIVE

## 2020-09-07 LAB — TYPE AND SCREEN
ABO/RH(D): O POS
Antibody Screen: NEGATIVE

## 2020-09-07 LAB — POCT FERN TEST: POCT Fern Test: POSITIVE

## 2020-09-07 MED ORDER — EPHEDRINE 5 MG/ML INJ: 10.0000 mg | INTRAVENOUS | Status: DC | PRN

## 2020-09-07 MED ORDER — MISOPROSTOL 50MCG HALF TABLET
ORAL_TABLET | ORAL | Status: AC
  Administered 2020-09-07: 50 ug via ORAL
  Filled 2020-09-07: qty 1

## 2020-09-07 MED ORDER — SIMETHICONE 80 MG PO CHEW: 80.0000 mg | CHEWABLE_TABLET | ORAL | Status: DC | PRN

## 2020-09-07 MED ORDER — BETAMETHASONE SOD PHOS & ACET 6 (3-3) MG/ML IJ SUSP
12.0000 mg | INTRAMUSCULAR | Status: DC
  Administered 2020-09-07: 12 mg via INTRAMUSCULAR
  Filled 2020-09-07: qty 5
  Filled 2020-09-07: qty 2

## 2020-09-07 MED ORDER — MISOPROSTOL 50MCG HALF TABLET: 50.0000 ug | ORAL_TABLET | ORAL | Status: DC | PRN

## 2020-09-07 MED ORDER — BENZOCAINE-MENTHOL 20-0.5 % EX AERO
1.0000 "application " | INHALATION_SPRAY | CUTANEOUS | Status: DC | PRN
  Administered 2020-09-07: 1 via TOPICAL
  Filled 2020-09-07: qty 56

## 2020-09-07 MED ORDER — SODIUM CHLORIDE 0.9 % IV SOLN
5.0000 10*6.[IU] | Freq: Once | INTRAVENOUS | Status: AC
  Administered 2020-09-07: 5 10*6.[IU] via INTRAVENOUS
  Filled 2020-09-07: qty 5

## 2020-09-07 MED ORDER — OXYTOCIN-SODIUM CHLORIDE 30-0.9 UT/500ML-% IV SOLN
2.5000 [IU]/h | INTRAVENOUS | Status: DC
  Filled 2020-09-07: qty 500

## 2020-09-07 MED ORDER — ONDANSETRON HCL 4 MG/2ML IJ SOLN: 4.0000 mg | INTRAMUSCULAR | Status: DC | PRN

## 2020-09-07 MED ORDER — LACTATED RINGERS IV SOLN: INTRAVENOUS | Status: DC

## 2020-09-07 MED ORDER — TERBUTALINE SULFATE 1 MG/ML IJ SOLN: 0.2500 mg | Freq: Once | INTRAMUSCULAR | Status: DC | PRN

## 2020-09-07 MED ORDER — COCONUT OIL OIL: 1.0000 "application " | TOPICAL_OIL | Status: DC | PRN

## 2020-09-07 MED ORDER — SODIUM CHLORIDE (PF) 0.9 % IJ SOLN
INTRAMUSCULAR | Status: DC | PRN
  Administered 2020-09-07: 12 mL/h via EPIDURAL

## 2020-09-07 MED ORDER — IBUPROFEN 600 MG PO TABS
600.0000 mg | ORAL_TABLET | Freq: Four times a day (QID) | ORAL | Status: DC
  Administered 2020-09-07 – 2020-09-08 (×2): 600 mg via ORAL
  Filled 2020-09-07 (×2): qty 1

## 2020-09-07 MED ORDER — OXYTOCIN 10 UNIT/ML IJ SOLN
INTRAMUSCULAR | Status: AC
  Filled 2020-09-07: qty 1

## 2020-09-07 MED ORDER — LACTATED RINGERS IV SOLN: 500.0000 mL | Freq: Once | INTRAVENOUS | Status: DC

## 2020-09-07 MED ORDER — OXYCODONE-ACETAMINOPHEN 5-325 MG PO TABS: 2.0000 | ORAL_TABLET | ORAL | Status: DC | PRN

## 2020-09-07 MED ORDER — LACTATED RINGERS IV SOLN: 500.0000 mL | INTRAVENOUS | Status: DC | PRN

## 2020-09-07 MED ORDER — FENTANYL CITRATE (PF) 100 MCG/2ML IJ SOLN
50.0000 ug | INTRAMUSCULAR | Status: DC | PRN
  Administered 2020-09-07: 100 ug via INTRAVENOUS

## 2020-09-07 MED ORDER — PRENATAL MULTIVITAMIN CH
1.0000 | ORAL_TABLET | Freq: Every day | ORAL | Status: DC
  Administered 2020-09-08 – 2020-09-09 (×2): 1 via ORAL
  Filled 2020-09-07 (×2): qty 1

## 2020-09-07 MED ORDER — ACETAMINOPHEN 325 MG PO TABS
650.0000 mg | ORAL_TABLET | ORAL | Status: DC | PRN
  Administered 2020-09-07 – 2020-09-08 (×2): 650 mg via ORAL
  Filled 2020-09-07 (×2): qty 2

## 2020-09-07 MED ORDER — ONDANSETRON HCL 4 MG PO TABS: 4.0000 mg | ORAL_TABLET | ORAL | Status: DC | PRN

## 2020-09-07 MED ORDER — DIPHENHYDRAMINE HCL 50 MG/ML IJ SOLN: 12.5000 mg | INTRAMUSCULAR | Status: DC | PRN

## 2020-09-07 MED ORDER — LIDOCAINE HCL (PF) 1 % IJ SOLN
INTRAMUSCULAR | Status: DC | PRN
  Administered 2020-09-07: 10 mL via EPIDURAL

## 2020-09-07 MED ORDER — LIDOCAINE HCL (PF) 1 % IJ SOLN
30.0000 mL | INTRAMUSCULAR | Status: DC | PRN
  Filled 2020-09-07: qty 30

## 2020-09-07 MED ORDER — ONDANSETRON HCL 4 MG/2ML IJ SOLN
4.0000 mg | Freq: Four times a day (QID) | INTRAMUSCULAR | Status: DC | PRN
  Administered 2020-09-07: 4 mg via INTRAVENOUS
  Filled 2020-09-07: qty 2

## 2020-09-07 MED ORDER — ACETAMINOPHEN 325 MG PO TABS: 650.0000 mg | ORAL_TABLET | ORAL | Status: DC | PRN

## 2020-09-07 MED ORDER — TETANUS-DIPHTH-ACELL PERTUSSIS 5-2.5-18.5 LF-MCG/0.5 IM SUSP: 0.5000 mL | Freq: Once | INTRAMUSCULAR | Status: DC

## 2020-09-07 MED ORDER — PHENYLEPHRINE 40 MCG/ML (10ML) SYRINGE FOR IV PUSH (FOR BLOOD PRESSURE SUPPORT): 80.0000 ug | PREFILLED_SYRINGE | INTRAVENOUS | Status: DC | PRN

## 2020-09-07 MED ORDER — PENICILLIN G POT IN DEXTROSE 60000 UNIT/ML IV SOLN
3.0000 10*6.[IU] | INTRAVENOUS | Status: DC
  Administered 2020-09-07 (×2): 3 10*6.[IU] via INTRAVENOUS
  Filled 2020-09-07 (×2): qty 50

## 2020-09-07 MED ORDER — DIPHENHYDRAMINE HCL 25 MG PO CAPS: 25.0000 mg | ORAL_CAPSULE | Freq: Four times a day (QID) | ORAL | Status: DC | PRN

## 2020-09-07 MED ORDER — OXYCODONE-ACETAMINOPHEN 5-325 MG PO TABS: 1.0000 | ORAL_TABLET | ORAL | Status: DC | PRN

## 2020-09-07 MED ORDER — DIBUCAINE (PERIANAL) 1 % EX OINT: 1.0000 "application " | TOPICAL_OINTMENT | CUTANEOUS | Status: DC | PRN

## 2020-09-07 MED ORDER — OXYTOCIN 10 UNIT/ML IJ SOLN
10.0000 [IU] | Freq: Once | INTRAMUSCULAR | Status: AC
  Administered 2020-09-07: 10 [IU] via INTRAMUSCULAR

## 2020-09-07 MED ORDER — OXYTOCIN BOLUS FROM INFUSION: 333.0000 mL | Freq: Once | INTRAVENOUS | Status: DC

## 2020-09-07 MED ORDER — FENTANYL-BUPIVACAINE-NACL 0.5-0.125-0.9 MG/250ML-% EP SOLN
12.0000 mL/h | EPIDURAL | Status: DC | PRN
  Filled 2020-09-07: qty 250

## 2020-09-07 MED ORDER — SENNOSIDES-DOCUSATE SODIUM 8.6-50 MG PO TABS
2.0000 | ORAL_TABLET | ORAL | Status: DC
  Administered 2020-09-07 – 2020-09-08 (×2): 2 via ORAL
  Filled 2020-09-07 (×2): qty 2

## 2020-09-07 MED ORDER — FENTANYL CITRATE (PF) 100 MCG/2ML IJ SOLN
INTRAMUSCULAR | Status: AC
  Filled 2020-09-07: qty 2

## 2020-09-07 MED ORDER — SOD CITRATE-CITRIC ACID 500-334 MG/5ML PO SOLN: 30.0000 mL | ORAL | Status: DC | PRN

## 2020-09-07 MED ORDER — WITCH HAZEL-GLYCERIN EX PADS: 1.0000 "application " | MEDICATED_PAD | CUTANEOUS | Status: DC | PRN

## 2020-09-07 NOTE — Anesthesia Procedure Notes (Signed)
Epidural Patient location during procedure: OB Start time: 09/07/2020 3:05 PM End time: 09/07/2020 3:14 PM  Staffing Anesthesiologist: Lucretia Kern, MD Performed: anesthesiologist   Preanesthetic Checklist Completed: patient identified, IV checked, risks and benefits discussed, monitors and equipment checked, pre-op evaluation and timeout performed  Epidural Patient position: sitting Prep: DuraPrep Patient monitoring: heart rate, continuous pulse ox and blood pressure Approach: midline Location: L3-L4 Injection technique: LOR air  Needle:  Needle type: Tuohy  Needle gauge: 17 G Needle length: 9 cm Needle insertion depth: 5 cm Catheter type: closed end flexible Catheter size: 19 Gauge Catheter at skin depth: 10 cm Test dose: negative  Assessment Events: blood not aspirated, injection not painful, no injection resistance, no paresthesia and negative IV test  Additional Notes Reason for block:procedure for pain

## 2020-09-07 NOTE — Progress Notes (Signed)
Labor Progress Note Alyssa Mercado is a 19 y.o. G3P0020 at [redacted]w[redacted]d presented for IOL for PPROM $RemoveB'@35'rehRFYHI$ .5.   S: Currently doing well. Much more uncomfortable with contractions, currently laying on left side with peanut in between legs. IV fentanyl helped "only a little" at first but now reports pain is "back up to 10/10".   O:  BP 105/67   Pulse 62   Temp (!) 97 F (36.1 C) (Axillary)   Resp 16   Ht $R'5\' 5"'UU$  (1.651 m)   Wt 73.2 kg   LMP 01/01/2020 (Within Weeks)   SpO2 99%   BMI 26.84 kg/m  EFM: baseline 135 bpm / moderate variability / accels present, no decels Toco: irregular, some periods with q3 minute contractions  Declined CVE at this time, will recheck when next due for Cytotec.  Last CVE: Dilation: 2 Effacement (%): 50 Cervical Position: Middle Station: -3 Presentation: Vertex Exam by:: Lynnda Shields RN   A&P: 19 y.o. [id number] [redacted]w[redacted]d presented for IOL for PPROM $RemoveB'@35'JfargCOA$ .5.  #Labor: Latent labor. Cytotec x1 at 1125, next due at 1525.  #Pain: IV prn, desires epidural  #FWB: Cat 1 #GBS unknown > PCN started 10/23 @ 1188 ----------------------- #Rubella non-immune: plan for PP MMR  Ezequiel Essex, MD 2:32 PM

## 2020-09-07 NOTE — H&P (Signed)
Alyssa Mercado is a 19 y.o. female G3P0020 at [redacted]w[redacted]d presenting for leaking of clear fluid starting before 5 am.  Pregnancy has been uncomplicated.  She receives care at [facility].     Nursing Staff Provider  Office Location [facility] Dating  LMP  Language  English Anatomy US  3x2x3 subamniotic cyst>>Rpt   Flu Vaccine  Declined Genetic Screen  NIPS: Low Risk female  AFP:   First Screen:  Quad:    TDaP vaccine   08/15/20 Hgb A1C or  GTT Early  Third trimester  Normal  Glucose, Fasting 65 - 91 mg/dL 65   Glucose, 1 hour 65 - 179 mg/dL 409   Glucose, 2 hour 65 - 152 mg/dL 79     Rhogam  N/A   LAB RESULTS     Blood Type O/Positive/-- (05/24 1007)   Feeding Plan Breast Antibody Negative (05/24 1007)  Contraception PP IUD Rubella <0.90 (05/24 1007) NON-IMMUNE  Circumcision unsure RPR Non Reactive (05/24 1007)   Pediatrician  List given HBsAg Negative (05/24 1007)   Support Person Alyssa Mercado HCVAb No results found for: HCVAB Negative  Prenatal Classes Info given HIV Non Reactive (05/24 1007)     BTL Consent N/A GBS  (For PCN allergy, check sensitivities)   VBAC Consent N/A Pap  Normal 2021    Hgb Electro  Negative  BP Cuff Rx [facility] 04/08/20 CF Negative  Weight Scale Rx [facility] 04/08/20 SMA  Increased SMA Carrier     Waterbirth  [ ]  Class [ ]  Consent [ ]  CNM visit    Induction  [ ]  Orders Entered [ ] Foley Y/N   . OB History    Gravida  3   Para      Term      Preterm      AB  2   Living        SAB      TAB  1   Ectopic      Multiple      Live Births             Past Medical History:  Diagnosis Date  . Asthma    Past Surgical History:  Procedure Laterality Date  . KNEE SURGERY     Family History: family history includes Hypertension in her father. Social History:  reports that she has never smoked. She has never used smokeless tobacco. She reports previous drug use. Drug: Marijuana. She reports that she does not drink  alcohol.     Maternal Diabetes: No Genetic Screening: Abnormal:  Results: Other: Maternal Ultrasounds/Referrals: Normal Fetal Ultrasounds or other Referrals:  None Maternal Substance Abuse:  No Significant Maternal Medications:  None Significant Maternal Lab Results:  Other:  Other Comments:  At risk for SMA carrier, GBS unknown  Review of Systems  Constitutional: Negative for chills, fatigue and fever.  Eyes: Negative for visual disturbance.  Respiratory: Negative for shortness of breath.   Cardiovascular: Negative for chest pain.  Gastrointestinal: Negative for abdominal pain and vomiting.  Genitourinary: Positive for vaginal discharge. Negative for difficulty urinating, dysuria, flank pain, pelvic pain, vaginal bleeding and vaginal pain.  Neurological: Negative for dizziness and headaches.  Psychiatric/Behavioral: Negative.    Maternal Medical History:  Reason for admission: Rupture of membranes.   Contractions: Onset was 1-2 hours ago.   Frequency: irregular.    Fetal activity: Perceived fetal activity is normal.    Prenatal complications: no prenatal complications Prenatal Complications - Diabetes: none.  Exam by:: Sharen Counter, CNM Blood pressure 136/83, pulse 94, temperature (!) 97.5 F (36.4 C), temperature source Oral, resp. rate 17, height 5\' 5"  (1.651 m), weight 73.2 kg, last menstrual period 01/01/2020, SpO2 99 %. Maternal Exam:  Uterine Assessment: Contraction strength is mild.  Contraction frequency is irregular.   Abdomen: Fetal presentation: vertex Vertex position confirmed by bedside 01/03/2020   Introitus: Amniotic fluid character: clear.     Fetal Exam Fetal Monitor Review: Mode: ultrasound.   Baseline rate: 130.  Variability: moderate (6-25 bpm).   Pattern: accelerations present and variable decelerations.    Fetal State Assessment: Category II - tracings are indeterminate.     Physical Exam Vitals and nursing note reviewed.   Constitutional:      Appearance: She is well-developed.  Cardiovascular:     Rate and Rhythm: Normal rate and regular rhythm.  Pulmonary:     Effort: Pulmonary effort is normal.     Breath sounds: Normal breath sounds.  Abdominal:     Palpations: Abdomen is soft.  Musculoskeletal:        General: Normal range of motion.     Cervical back: Normal range of motion.  Skin:    General: Skin is warm and dry.  Neurological:     Mental Status: She is alert and oriented to person, place, and time.  Psychiatric:        Behavior: Behavior normal.        Thought Content: Thought content normal.        Judgment: Judgment normal.     Prenatal labs: ABO, Rh: O/Positive/-- (05/24 1007) Antibody: Negative (05/24 1007) Rubella: <0.90 (05/24 1007) RPR: Non Reactive (09/02 1050)  HBsAg: Negative (05/24 1007)  HIV: Non Reactive (09/02 1050)  GBS:   Unknown, collected on 09/07/20  Assessment/Plan: 19 y.o. G3P0020 at [redacted]w[redacted]d PPROM without onset of active labor GBS unknown  Consult NICU as beds are limited There is a possibility that pt will deliver and infant will need transfer due to limited NICU beds, pt aware and prefers admission in [place]. Contractions without evidence of active labor, vertex confirmed by bedside [place].  PCN for GBS unknown, GBS culture pending BMZ x 2 Q 24 hours  Continuous EFM Expectant management  Plans to breastfeed Desires PP IUD  [place] 09/07/2020, 6:13 AM

## 2020-09-07 NOTE — Lactation Note (Addendum)
This note was copied from a baby's chart. Lactation Consultation Note  Patient Name: Boy Shantell Belongia AVWUJ'W Date: 09/07/2020 Reason for consult: Initial assessment;Mother's request;Primapara;1st time breastfeeding;Late-preterm 34-36.6wks;Infant < 6lbs  Infant is 35 weeks 4 hours old. Mom states infant nursed well between 7-15 minutes at the 1832, 1900 and 1956 feedings. Infant given via spoon 4 ml of EBM at 1855. LC not able to assess a latch at the time of the visit since infant given Similac Neosure 22 calorie 8 ml 30 minutes prior to visit.   LC examined Mom's breast and her nipples were erect with no signs of nipple trauma. LC set Mom up with a DEBP with a 24 flange and got her pumping. LC reviewed with Mom how to feed based on cues, use of the I's and O/s sheet to monitor feeds, urine and fecal output. Parents are aware to not use a pacifier for the first 4 weeks to establish a good latch.   LC reviewed with guidelines for LPTI to reduce caloric loss and feed within a 30 minute time frame. Parents are aware to supplement with Similac Neosure 22 calorie after each feed and increase amounts based on guidelines and hours after birth.  Mom has Medicaid but not WIC and lives in [place]. LC will fax a [facility] referral.   Plan 1. To feed infant based on cues 8-12 x in 24 hour period no more than 3 hours without attempt. Mom to place infant STS for feeding and to look for signs of milk transfer during feeds.          2. Mom to use the DEBP to increase stimulation q 3 hour for 15 minutes.         3. Parents to supplement based on LPTI guidelines of Similac Neosure or EBM.          4. Mom will call LC or RN to assist with the next latch when infant starts to cue.   LC returned to get signature of the Mom for [facility] referral sent. Mom nursing at the time in cradle. LC asked Mom to turn on the light to assist with the latch, but Mom stating things were going well and she did not need  assistance.

## 2020-09-07 NOTE — Progress Notes (Signed)
Labor Progress Note Alyssa Mercado is a 19 y.o. G3P0020 at 60w5dpresented for IOL for PPROM _0 .5.   S: Currently doing well without complaints. Was sitting up drinking broth for breakfast. Not feeling any contractions.   O:  BP (!) 90/49    Pulse 63    Temp 98.5 F (36.9 C) (Oral)    Resp 16    Ht _1  (1.651 m)    Wt 73.2 kg    LMP 01/01/2020 (Within Weeks)    SpO2 99%    BMI 26.84 kg/m  EFM: baseline 135 bpm / moderate variability / accels present, no decels Toco: irregular, some periods with q3 minute contractions  CVE: Dilation: 2 Effacement (%): 50 Cervical Position: Middle Station: -3 Presentation: Vertex Exam by:: JLynnda ShieldsRN   A&P: 19y.o. [id number] for IOL for PPROM _2 .5.  #Labor: Progressing.  #Pain: IV prn, desires epidural  #FWB: Cat 1 #GBS unknown > PCN started 10/23 @ [id number]---------------------- #Rubella non-immune: plan for PP MMR  CaEzequiel EssexMD 10:45 AM

## 2020-09-07 NOTE — MAU Note (Signed)
Leaking of clear fluid before 5am, denies complications with pregnancy

## 2020-09-07 NOTE — Anesthesia Preprocedure Evaluation (Signed)
Anesthesia Evaluation  Patient identified by MRN, date of birth, ID band Patient awake    Reviewed: Allergy & Precautions, H&P , NPO status , Patient's Chart, lab work & pertinent test results  History of Anesthesia Complications Negative for: history of anesthetic complications  Airway Mallampati: II  TM Distance: >3 FB Neck ROM: full    Dental no notable dental hx.    Pulmonary asthma ,    Pulmonary exam normal        Cardiovascular negative cardio ROS Normal cardiovascular exam Rhythm:regular Rate:Normal     Neuro/Psych negative neurological ROS  negative psych ROS   GI/Hepatic Neg liver ROS, GERD  ,  Endo/Other  negative endocrine ROS  Renal/GU negative Renal ROS  negative genitourinary   Musculoskeletal   Abdominal   Peds  Hematology negative hematology ROS (+)   Anesthesia Other Findings   Reproductive/Obstetrics (+) Pregnancy                             Anesthesia Physical Anesthesia Plan  ASA: II  Anesthesia Plan: Epidural   Post-op Pain Management:    Induction:   PONV Risk Score and Plan:   Airway Management Planned:   Additional Equipment:   Intra-op Plan:   Post-operative Plan:   Informed Consent: I have reviewed the patients History and Physical, chart, labs and discussed the procedure including the risks, benefits and alternatives for the proposed anesthesia with the patient or authorized representative who has indicated his/her understanding and acceptance.       Plan Discussed with:   Anesthesia Plan Comments:         Anesthesia Quick Evaluation

## 2020-09-07 NOTE — Discharge Summary (Signed)
Postpartum Discharge Summary    Patient Name: Alyssa Mercado DOB: 2001-02-28 MRN: [id number]  Date of admission: 09/07/2020 Delivery date:09/07/2020  Delivering provider: Ezequiel Essex  Date of discharge: 09/08/2020  Admitting diagnosis: Preterm premature rupture of membranes (PPROM) with unknown onset of labor [O42.919] Intrauterine pregnancy: [redacted]w[redacted]d     Secondary diagnosis:  Active Problems:   Preterm premature rupture of membranes (PPROM) with unknown onset of labor   SVD (spontaneous vaginal delivery)  Additional problems: abnormal genetic test, GERD, morning sickness    Discharge diagnosis: Preterm Pregnancy Delivered                                              Post partum procedures: none Augmentation: Cytotec Complications: None  Hospital course: Induction of Labor With Vaginal Delivery   19 y.o. yo G3P0020 at [redacted]w[redacted]d was admitted to the hospital 09/07/2020 for induction of labor.  Indication for induction: PROM.  Patient had an uncomplicated labor course as follows: Membrane Rupture Time/Date: 2:00 AM ,09/07/2020   Delivery Method:Vaginal, Spontaneous  Episiotomy: None  Lacerations:  1st degree;Perineal  Details of delivery can be found in separate delivery note. Patient had a routine postpartum course. Patient is discharged home 09/08/20.  Newborn Data: Birth date:09/07/2020  Birth time:4:31 PM  Gender:Female  Living status:Living  Apgars:8 ,9  Weight:2500 g   Magnesium Sulfate received: No BMZ received: Yes Rhophylac:N/A MMR: administered prior to discharge T-DaP:Given prenatally Flu: No Transfusion:No  Physical exam  Vitals:   09/07/20 1940 09/07/20 2316 09/08/20 0306 09/08/20 0740  BP: 119/88 98/80 112/64 (!) 108/57  Pulse: 62 67 60 63  Resp: $Remo'16 15 16 16  'ZksIJ$ Temp: 98 F (36.7 C) 98.3 F (36.8 C) 98.9 F (37.2 C) 97.7 F (36.5 C)  TempSrc: Oral Oral Oral Oral  SpO2: 100% 99% 99% 99%  Weight:      Height:       General: alert,  cooperative and no distress Lochia: appropriate Uterine Fundus: firm Incision: N/A DVT Evaluation: No evidence of DVT seen on physical exam. No cords or calf tenderness. No significant calf/ankle edema. Labs: Lab Results  Component Value Date   WBC 14.2 (H) 09/07/2020   HGB 13.4 09/07/2020   HCT 40.2 09/07/2020   MCV 95.5 09/07/2020   PLT 326 09/07/2020   No flowsheet data found. Edinburgh Score: Edinburgh Postnatal Depression Scale Screening Tool 09/08/2020  I have been able to laugh and see the funny side of things. 0  I have looked forward with enjoyment to things. 0  I have blamed myself unnecessarily when things went wrong. 1  I have been anxious or worried for no good reason. 2  I have felt scared or panicky for no good reason. 1  Things have been getting on top of me. 0  I have been so unhappy that I have had difficulty sleeping. 0  I have felt sad or miserable. 0  I have been so unhappy that I have been crying. 0  The thought of harming myself has occurred to me. 0  Edinburgh Postnatal Depression Scale Total 4     After visit meds:  Allergies as of 09/08/2020   No Known Allergies     Medication List    STOP taking these medications   Blood Pressure Monitor [place]  Weight Scale Misc   nystatin cream Commonly known as: MYCOSTATIN   pantoprazole 40 MG tablet Commonly known as: Protonix   promethazine 25 MG tablet Commonly known as: PHENERGAN   terconazole 0.8 % vaginal cream Commonly known as: TERAZOL 3   triamcinolone cream 0.1 % Commonly known as: KENALOG     TAKE these medications   acetaminophen 500 MG tablet Commonly known as: TYLENOL Take 2 tablets (1,000 mg total) by mouth every 8 (eight) hours.   coconut oil Oil Apply 1 application topically as needed (nipple pain).   ibuprofen 800 MG tablet Commonly known as: ADVIL Take 1 tablet (800 mg total) by mouth every 8 (eight) hours.    Vitafol Gummies 3.33-0.333-34.8 MG Chew Chew 3 each by mouth daily.     Discharge home in stable condition Infant Feeding: No evidence of DVT seen on physical exam. No cords or calf tenderness. No significant calf/ankle edema. Infant Disposition:home with mother Discharge instruction: per After Visit Summary and Postpartum booklet. Activity: Advance as tolerated. Pelvic rest for 6 weeks.  Diet: routine diet Future Appointments: Future Appointments  Date Time Provider [place]  09/12/2020  9:30 AM Laury Deep, CNM [facility] None  09/19/2020  9:30 AM Laury Deep, CNM [facility] None  09/26/2020  9:30 AM Laury Deep, CNM [facility] None   Follow up Visit: Message sent to clinic to schedule PP appt.  Please schedule this patient for a In person postpartum visit in 4 weeks with the following provider: Any provider. Additional Postpartum F/U:n/a  Low risk pregnancy complicated by: n/a Delivery mode:  Vaginal, Spontaneous  Anticipated Birth Control: plan for outpatient IUD  09/08/2020 Randa Ngo, MD

## 2020-09-08 LAB — CULTURE, BETA STREP (GROUP B ONLY)

## 2020-09-08 MED ORDER — ACETAMINOPHEN 500 MG PO TABS: 1000.0000 mg | ORAL_TABLET | Freq: Three times a day (TID) | ORAL | 0 refills | Status: AC

## 2020-09-08 MED ORDER — IBUPROFEN 800 MG PO TABS: 800.0000 mg | ORAL_TABLET | Freq: Three times a day (TID) | ORAL | 0 refills | Status: AC

## 2020-09-08 MED ORDER — COCONUT OIL OIL: 1.0000 "application " | TOPICAL_OIL | 0 refills | Status: AC | PRN

## 2020-09-08 MED ORDER — MEASLES, MUMPS & RUBELLA VAC IJ SOLR: 0.5000 mL | Freq: Once | INTRAMUSCULAR | Status: DC

## 2020-09-08 MED ORDER — IBUPROFEN 800 MG PO TABS
800.0000 mg | ORAL_TABLET | Freq: Three times a day (TID) | ORAL | Status: DC
  Administered 2020-09-08 – 2020-09-09 (×3): 800 mg via ORAL
  Filled 2020-09-08 (×3): qty 1

## 2020-09-08 MED ORDER — ACETAMINOPHEN 500 MG PO TABS
1000.0000 mg | ORAL_TABLET | Freq: Three times a day (TID) | ORAL | Status: DC
  Administered 2020-09-08 – 2020-09-09 (×4): 1000 mg via ORAL
  Filled 2020-09-08 (×4): qty 2

## 2020-09-08 NOTE — Anesthesia Postprocedure Evaluation (Signed)
Anesthesia Post Note  Patient: Guillermo Difrancesco Tigert  Procedure(s) Performed: AN AD HOC LABOR EPIDURAL     Patient location during evaluation: Mother Baby Anesthesia Type: Epidural Level of consciousness: awake and alert and oriented Pain management: satisfactory to patient Vital Signs Assessment: post-procedure vital signs reviewed and stable Respiratory status: spontaneous breathing and nonlabored ventilation Cardiovascular status: stable Postop Assessment: no headache, no backache, no signs of nausea or vomiting, adequate PO intake, patient able to bend at knees and able to ambulate (patient up walking) Anesthetic complications: no   No complications documented.  Last Vitals:  Vitals:   09/08/20 0306 09/08/20 0740  BP: 112/64 (!) 108/57  Pulse: 60 63  Resp: 16 16  Temp: 37.2 C 36.5 C  SpO2: 99% 99%    Last Pain:  Vitals:   09/08/20 0740  TempSrc: Oral  PainSc:    Pain Goal:                   Madison Hickman

## 2020-09-08 NOTE — Discharge Instructions (Signed)

## 2020-09-09 NOTE — Lactation Note (Signed)
This note was copied from a baby's chart. Lactation Consultation Note  Patient Name: Boy Graceland Wachter [id number] Date: 09/09/2020    [facility] Follow Up Visit:  Attempted to visit with parents, however, they were asleep.  I will return later this a.m.   Maternal Data    Feeding Feeding Type: Breast Milk Nipple Type: Extra Slow Flow  LATCH Score                   Interventions    Lactation Tools Discussed/Used     Consult Status      Eytan Carrigan R Delisha Peaden 09/09/2020, 8:03 AM

## 2020-09-09 NOTE — Lactation Note (Signed)
This note was copied from a baby's chart. Lactation Consultation Note  Patient Name: Alyssa Mercado [id number] Date: 09/09/2020  P1, 31 hours LPTI, no weight change Infant currently on phototherapy  Mom is currently BF and supplementing with 22 kcal Similac Neosure with iron.  Mom is in process of obtaining a DEBP with her insurance. Per mom, infant BF less than 2 hours ago and supplemented with formula, infant is  asleep in mom's arms on billi lights. LC did not observe latch at this time due infant not cuing to feed and was BF/Formula less than 2 hours ago before [facility] entered the room. Per mom, she will start pumping as previously advised by [facility]  She has  pumped only twice today.  Mom's plan: 1 BF according to cues & LPTI feeding policy ( green sheet)                      2. Dad will supplement infant with formula according 2 day (10 -20 mls) per feeding after mom latches infant at breast.                     3. While dad is supplementing infant with formula mom will use the DEBP and pump for 15 minutes on initial setting, mom knows to pump every 3 hours.                      4. Mom knows to call RN or LC if she has any questions, concerns or need assistance with latching infant at the breast.   Maternal Data    Feeding Feeding Type: Bottle Fed - Formula Nipple Type: Extra Slow Flow  LATCH Score                   Interventions    Lactation Tools Discussed/Used     Consult Status      Alyssa Mercado 09/09/2020, 12:19 AM

## 2020-09-09 NOTE — Lactation Note (Signed)
This note was copied from a baby's chart. Lactation Consultation Note  Patient Name: Alyssa Mercado [id number] Date: 09/09/2020 Reason for consult: Follow-up assessment   P1 mother whose infant is now 62 hours old.  This is a LPTI at 35+5 weeks with a CGA of 36+0 weeks.  Baby is receiving phototherapy via bili blanket.  The last bilirubin level was 9.7 mg/dl at approximately 39 hours of life.  I was present in the room during the SLP consult.  SLP consultants, Alyssa Mercado and Alyssa Mercado present.  Consultants discussed feeding plan with mother and discussed using the Dr. Theora Gianotti preemie nipple.  This is the type of bottle mother has for home use.  Consultants assisted to position baby and demonstrated how to effectively place the nipple near baby's lip. Baby did not show much interest in receiving the bottle, however, he did attempt to feed.  Mother had put him to the breast approximately an hour prior for a few minutes of feeding.    Consultants followed up by working with baby while I assisted mother to use the DEBP.  She had a #27 flange on the left breast which was too large.  I changed the flange to a #24 for better fit.  Showed mother how to arrange the flanges to fit her breast size and reviewed pump settings.  Observed the #24 flanges to be a correct fit at this time. I left the room during the pumping session.  WIC referral was faxed by the previous LC.  Suggested mother follow up with a phone call to [name] today.  Discussed the importance of having a DEBP at discharge to continue to pump for supplementation for premature baby.  Mother will plan to do this today.    Per SLP, mother will bottle feed prior to breast feeding for today only.  Baby will be re-evaluated tomorrow.  Mother will call her RN/LC for any questions.  RN updated and plan confirmed. Father present.   Maternal Data    Feeding Feeding Type: Breast Milk Nipple Type: Dr. Irving Burton Preemie (wide base)  LATCH Score Latch:  Repeated attempts needed to sustain latch, nipple held in mouth throughout feeding, stimulation needed to elicit sucking reflex.  Audible Swallowing: A few with stimulation  Type of Nipple: Everted at rest and after stimulation  Comfort (Breast/Nipple): Soft / non-tender  Hold (Positioning): Assistance needed to correctly position infant at breast and maintain latch.  LATCH Score: 7  Interventions    Lactation Tools Discussed/Used     Consult Status Consult Status: Follow-up Date: 09/10/20 Follow-up type: In-patient    Alyssa Mercado 09/09/2020, 11:05 AM

## 2020-09-09 NOTE — Lactation Note (Signed)
This note was copied from a baby's chart. Lactation Consultation Note  Patient Name: Alyssa Mercado [id number] Date: 09/09/2020 Reason for consult: Follow-up assessment   P1 mother whose infant is now 67 hours old.  This is a LPTI at 35+5 weeks with a CGA of 36+0 weeks.  Baby is receiving phototherapy via bili blanket.  The last bilirubin level was 9.7 mg/dl at approximately 39 hours of life.   Baby was asleep in mother's arms when I arrived.  RN in room.  [name] had started arousing and SLP consult scheduled for 1030 this morning.  Discussed baby's feeding since birth.  Mother has been attempting breast feeding when he is interested.  She has been supplementing with EBM and Neosure 22 cal formula.  Reviewed supplementation guidelines with parents and encouraged to increase volume while maintaining a total feeding time of 30 minutes or less.  Mother feels like he is doing "okay" with the purple extra slow flow nipple but did not do well with the yellow nipple.  Mother stated he does show cues at times.    I will plan to return for the SLP consult.  Father present.     Maternal Data    Feeding Feeding Type: Breast Milk Nipple Type: Dr. Irving Burton Preemie (wide base)  LATCH Score Latch: Repeated attempts needed to sustain latch, nipple held in mouth throughout feeding, stimulation needed to elicit sucking reflex.  Audible Swallowing: A few with stimulation  Type of Nipple: Everted at rest and after stimulation  Comfort (Breast/Nipple): Soft / non-tender  Hold (Positioning): Assistance needed to correctly position infant at breast and maintain latch.  LATCH Score: 7  Interventions    Lactation Tools Discussed/Used     Consult Status Consult Status: Follow-up Date: 09/10/20 Follow-up type: In-patient    Brady Plant R Davy Westmoreland 09/09/2020, 10:58 AM

## 2020-09-09 NOTE — Discharge Summary (Signed)
Postpartum Discharge Summary                          Patient Name: Alyssa Mercado DOB: 05/14/2001 MRN: [id number]  Date of admission: 09/07/2020 Delivery date:09/07/2020  Delivering provider: Ezequiel Essex  Date of discharge: 09/09/2020  Admitting diagnosis: Preterm premature rupture of membranes (PPROM) with unknown onset of labor [O42.919] Intrauterine pregnancy: [redacted]w[redacted]d    Secondary diagnosis:  Active Problems:   Preterm premature rupture of membranes (PPROM) with unknown onset of labor   SVD (spontaneous vaginal delivery)  Additional problems: abnormal genetic test, GERD, morning sickness                              Discharge diagnosis: Preterm Pregnancy Delivered                                              Post partum procedures: none Augmentation: Cytotec Complications: None  Hospital course: Induction of Labor With Vaginal Delivery   19y.o. yo G3P0020 at 354w5das admitted to the hospital 09/07/2020 for induction of labor.  Indication for induction: PROM.  Patient had an uncomplicated labor course as follows: Membrane Rupture Time/Date: 2:00 AM ,09/07/2020   Delivery Method:Vaginal, Spontaneous  Episiotomy: None  Lacerations:  1st degree;Perineal  Details of delivery can be found in separate delivery note. Patient had a routine postpartum course. Patient is discharged home 09/08/20.  Newborn Data: Birth date:09/07/2020  Birth time:4:31 PM  Gender:Female  Living status:Living  Apgars:8 ,9  Weight:2500 g   Magnesium Sulfate received: No BMZ received: Yes Rhophylac:N/A MMR: declined T-DaP:Given prenatally Flu: No Transfusion:No  Physical exam        Vitals:   09/07/20 1940 09/07/20 2316 09/08/20 0306 09/08/20 0740  BP: 119/88 98/80 112/64 (!) 108/57  Pulse: 62 67 60 63  Resp: _0 Temp: 98 F (36.7 C) 98.3 F (36.8 C) 98.9 F (37.2 C) 97.7 F (36.5 C)  TempSrc: Oral Oral Oral Oral  SpO2: 100% 99% 99% 99%  Weight:       Height:       General: alert, cooperative and no distress Lochia: appropriate Uterine Fundus: firm Incision: N/A DVT Evaluation: No evidence of DVT seen on physical exam. No cords or calf tenderness. No significant calf/ankle edema. Labs: Recent Labs       Lab Results  Component Value Date   WBC 14.2 (H) 09/07/2020   HGB 13.4 09/07/2020   HCT 40.2 09/07/2020   MCV 95.5 09/07/2020   PLT 326 09/07/2020     No flowsheet data found. Edinburgh Score: Edinburgh Postnatal Depression Scale Screening Tool 09/08/2020  I have been able to laugh and see the funny side of things. 0  I have looked forward with enjoyment to things. 0  I have blamed myself unnecessarily when things went wrong. 1  I have been anxious or worried for no good reason. 2  I have felt scared or panicky for no good reason. 1  Things have been getting on top of me. 0  I have been so unhappy that I have had difficulty sleeping. 0  I have felt sad or miserable. 0  I have been so unhappy that I have been crying. 0  The thought of harming myself has  occurred to me. 0  Edinburgh Postnatal Depression Scale Total 4     After visit meds:  Allergies as of 09/08/2020   No Known Allergies     Medication List    STOP taking these medications   Blood Pressure Monitor [place] Supp Sm Misc   [place]ji Weight Scale Misc   nystatin cream Commonly known as: MYCOSTATIN   pantoprazole 40 MG tablet Commonly known as: Protonix   promethazine 25 MG tablet Commonly known as: PHENERGAN   terconazole 0.8 % vaginal cream Commonly known as: TERAZOL 3   triamcinolone cream 0.1 % Commonly known as: KENALOG     TAKE these medications   acetaminophen 500 MG tablet Commonly known as: TYLENOL Take 2 tablets (1,000 mg total) by mouth every 8 (eight) hours.   coconut oil Oil Apply 1 application topically as needed (nipple pain).   ibuprofen 800 MG tablet Commonly  known as: ADVIL Take 1 tablet (800 mg total) by mouth every 8 (eight) hours.   Vitafol Gummies 3.33-0.333-34.8 MG Chew Chew 3 each by mouth daily.     Discharge home in stable condition Infant Feeding: No evidence of DVT seen on physical exam. No cords or calf tenderness. No significant calf/ankle edema. Infant Disposition:home with mother Discharge instruction: per After Visit Summary and Postpartum booklet. Activity: Advance as tolerated. Pelvic rest for 6 weeks.  Diet: routine diet Future Appointments: Future Appointments  Date Time Provider [place]  09/12/2020  9:30 AM Laury Deep, CNM [facility] None  09/19/2020  9:30 AM Laury Deep, CNM [facility] None  09/26/2020  9:30 AM Laury Deep, CNM [facility] None   Follow up Visit: Message sent to clinic to schedule PP appt.  Please schedule this patient for a In person postpartum visit in 4 weeks with the following provider: Any provider. Additional Postpartum F/U:n/a  Low risk pregnancy complicated by: n/a Delivery mode: Vaginal, Spontaneous  Anticipated Birth Control:plan for outpatient IUD  Arleigh Dicola, Gildardo Cranker, MD OB Fellow, [facility] 09/09/2020 6:33 AM

## 2020-09-10 ENCOUNTER — Ambulatory Visit: Payer: Self-pay

## 2020-09-10 NOTE — Lactation Note (Signed)
This note was copied from a baby's chart. Lactation Consultation Note  Patient Name: Alyssa Mercado [id number] Date: 09/10/2020 Reason for consult: Follow-up assessment  Follow up visit to 66 hours old LPTI with 4.00% weight loss of a P1 mother. Baby is sleeping in mother's arms upon arrival. Mother states breastfeeding is going well. Mother reports bottle-feeding with Dr. Theora Gianotti has been helpful, infant is takin ~4mL of formula. Infant has been been having good voids and stools, per mother. Mother explains she has been pumping and her nipples are a little sore. Talked to mother about using comfort gels after pumping. Also, mother will use coconut oil inside flanges when pumping. Mother is aware of wiping coconut oil before applying comfort gels.  Mother states her breasts are getting fuller and firmer. Discussed engorgement and reinforced pumping after feedings to empty breasts, using ice to help with swelling.  Encouraged mother to contact [facility] regarding referral that was faxed on behalf of her infant for a DEBP.   Feeding plan:  1. Breastfeed following hunger cues.  2. Stimulate infant awake at the breast. 3. Offer breast 8-12 times in 24h period to establish good milk supply.   4. Continue pumping after feeding and/or as needed for supplementation purposes. 5. If needed supplement with formula following guidelines, pace bottle feeding and fullness cues.   6. Encouraged maternal rest, hydration and food intake.  7. Contact Lactation Services or local resources for support, questions or concerns.    All questions answered at this time. Family may be discharged home today.  Feeding Feeding Type: Bottle Fed - Breast Milk Nipple Type: Dr. Levert Feinstein Preemie  Interventions Interventions: Breast feeding basics reviewed;Expressed milk;DEBP;Comfort gels;Coconut oil  Lactation Tools Discussed/Used Tools: Pump;Comfort gels;Bottle Breast pump type: Double-Electric Breast Pump WIC  Program: Yes   Consult Status Consult Status: Complete Date: 09/10/20 Follow-up type: Call as needed    Broghan Pannone A Higuera Ancidey 09/10/2020, 10:48 AM

## 2020-09-12 ENCOUNTER — Encounter: Payer: Medicaid Other | Admitting: Obstetrics and Gynecology

## 2020-09-19 ENCOUNTER — Encounter: Payer: Medicaid Other | Admitting: Obstetrics and Gynecology

## 2020-09-26 ENCOUNTER — Encounter: Payer: Medicaid Other | Admitting: Obstetrics and Gynecology

## 2020-10-17 ENCOUNTER — Ambulatory Visit: Payer: Medicaid Other | Admitting: Obstetrics and Gynecology

## 2020-10-31 ENCOUNTER — Encounter: Payer: Self-pay | Admitting: Obstetrics and Gynecology

## 2020-10-31 ENCOUNTER — Ambulatory Visit (INDEPENDENT_AMBULATORY_CARE_PROVIDER_SITE_OTHER): Payer: Medicaid Other | Admitting: Obstetrics and Gynecology

## 2020-10-31 ENCOUNTER — Other Ambulatory Visit: Payer: Self-pay

## 2020-10-31 DIAGNOSIS — Z30430 Encounter for insertion of intrauterine contraceptive device: Secondary | ICD-10-CM | POA: Diagnosis not present

## 2020-10-31 DIAGNOSIS — Z8759 Personal history of other complications of pregnancy, childbirth and the puerperium: Secondary | ICD-10-CM

## 2020-10-31 DIAGNOSIS — Z3202 Encounter for pregnancy test, result negative: Secondary | ICD-10-CM | POA: Diagnosis not present

## 2020-10-31 LAB — POCT URINE PREGNANCY: Preg Test, Ur: NEGATIVE

## 2020-10-31 MED ORDER — LEVONORGESTREL 19.5 MCG/DAY IU IUD
INTRAUTERINE_SYSTEM | Freq: Once | INTRAUTERINE | Status: AC
  Administered 2020-10-31: 10:00:00 52 mg via INTRAUTERINE

## 2020-10-31 NOTE — Progress Notes (Signed)
    IUD INSERTION PROCEDURE NOTE  Alyssa Mercado is a 19 y.o. G3P0020 here for Liletta insertion. No GYN concerns.   She was counseled regarding the risks/benefits of IUD including insertion risk of infection, hemorrhage, damage to surrounding tissue and organs, uterine perforation. She was counseled regarding risks of IUD including implantation into uterine wall, migration outside of uterus, possible need for hysteroscopic or laparoscopic removal, ovarian cysts, expulsion. She was advised that risk of pregnancy is low with negative UPT but is not zero and IUD insertion may cause miscarriage. Reviewed that she is also at slightly higher risk for ectopic pregnancy and she should take a pregnancy test if she believes she may be pregnant. She was advised to use backup method of protection for one week. She verbalized understanding of all of the above and consent signed.   Last intercourse was before pregnancy Last pap smear was on 06/06/2020 and was Normal UPT today: Negative  IUD Insertion  Patient identified and an adequate time out was performed. Speculum placed in the vagina. The cervix was cleaned with Betadine x 2 and grasped anteriorly with a single tooth tenaculum.  A uterine sound was used to sound the uterus to 5.5 cm;  the IUD was then placed per manufacturer's recommendations. Strings trimmed to 3 cm. Tenaculum was removed, good hemostasis noted. Patient tolerated procedure well.   Patient was given post-procedure instructions.  She was reminded to have backup contraception for one week during this transition period between IUDs.  Patient was also asked to check IUD strings periodically and follow up in 4 weeks for IUD check.  Liletra IUD Exp: 12/2023 Lot: [id number]  [name], CNM 10/31/2020 10:22 AM

## 2020-10-31 NOTE — Progress Notes (Signed)
Post Partum Visit Note  Alyssa Mercado is a 19 y.o. G80P0020 female who presents for a postpartum visit. She is 7 weeks postpartum following a normal spontaneous vaginal delivery.  I have fully reviewed the prenatal and intrapartum course. The delivery was at 35.5 gestational weeks.  Anesthesia: epidural. Postpartum course has been uncomplicated. Baby is doing well. Baby is feeding by bottle - Similac Neosure. Bleeding no bleeding. Bowel function is normal. Bladder function is normal. Patient is not sexually active. Contraception method is none. Postpartum depression screening: negative.   The pregnancy intention screening data noted above was reviewed. Potential methods of contraception were discussed. The patient elected to proceed with IUD or IUS.    Edinburgh Postnatal Depression Scale - 10/31/20 0937      Edinburgh Postnatal Depression Scale:  In the Past 7 Days   I have been able to laugh and see the funny side of things. 0    I have looked forward with enjoyment to things. 0    I have blamed myself unnecessarily when things went wrong. 0    I have been anxious or worried for no good reason. 0    I have felt scared or panicky for no good reason. 0    Things have been getting on top of me. 0    I have been so unhappy that I have had difficulty sleeping. 0    I have felt sad or miserable. 0    I have been so unhappy that I have been crying. 0    The thought of harming myself has occurred to me. 0    Edinburgh Postnatal Depression Scale Total 0            The following portions of the patient's history were reviewed and updated as appropriate: allergies, current medications, past family history, past medical history, past social history, past surgical history and problem list.  Review of Systems Constitutional: negative Eyes: negative Ears, nose, mouth, throat, and face: negative Respiratory: negative Cardiovascular: negative Gastrointestinal:  negative Genitourinary:negative Integument/breast: negative Hematologic/lymphatic: negative Musculoskeletal:negative Neurological: negative Behavioral/Psych: negative Endocrine: negative Allergic/Immunologic: negative    Objective:  BP 114/74 (BP Location: Left Arm, Patient Position: Sitting, Cuff Size: Normal)   Pulse 65   Temp 97.7 F (36.5 C) (Oral)   Ht 5\' 7"  (1.702 m)   Wt 139 lb 3.2 oz (63.1 kg)   LMP 01/01/2020 (Within Weeks)   Breastfeeding No   BMI 21.80 kg/m    General:  alert, cooperative and no distress   Breasts:  inspection negative, no nipple discharge or bleeding, no masses or nodularity palpable  Lungs: clear to auscultation bilaterally and normal percussion bilaterally  Heart:  regular rate and rhythm, S1, S2 normal, no murmur, click, rub or gallop  Abdomen: soft, non-tender; bowel sounds normal; no masses,  no organomegaly   Vulva:  normal  Vagina: normal vagina  Cervix:  anteverted  Corpus: normal size, contour, position, consistency, mobility, non-tender  Adnexa:  normal adnexa  Rectal Exam: Not performed.        Assessment:  Encounter for postpartum visit - Normal postpartum exam. Pap smear not done at today's visit.   Encounter for insertion of intrauterine contraceptive device (IUD)  - POCT urine pregnancy,  - levonorgestrel (LILETTA) 19.5 MCG/DAY IUD  Plan:   Essential components of care per ACOG recommendations:  1.  Mood and well being: Patient with negative depression screening today. Reviewed local resources for support.  - Patient does  not use tobacco.  - hx of drug use? No    2. Infant care and feeding:  -Patient currently breastmilk feeding? No  -Social determinants of health (SDOH) reviewed in EPIC. No concerns  3. Sexuality, contraception and birth spacing - Patient does not want a pregnancy in the next year.  Desired family size is 1 children.  - Reviewed forms of contraception in tiered fashion. Patient desired IUD today.    - Discussed birth spacing of 18 months  4. Sleep and fatigue -Encouraged family/partner/community support of 4 hrs of uninterrupted sleep to help with mood and fatigue  5. Physical Recovery  - Discussed patients delivery and complications - Patient had a bilateral labial lacerations and a hemostatic first degree perineal laceration, perineal healing reviewed. Patient expressed understanding - Patient has urinary incontinence? No - Patient is safe to resume physical and sexual activity  6.  Health Maintenance - Last pap smear done 06/06/2020 and was normal with negative HPV. Mammogram at age 75  7. No Chronic Disease - PCP follow up  [name], CNM [facility] [employer]

## 2020-11-06 ENCOUNTER — Encounter: Payer: Self-pay | Admitting: Obstetrics and Gynecology

## 2020-11-28 ENCOUNTER — Ambulatory Visit (INDEPENDENT_AMBULATORY_CARE_PROVIDER_SITE_OTHER): Payer: Medicaid Other | Admitting: Obstetrics and Gynecology

## 2020-11-28 ENCOUNTER — Other Ambulatory Visit: Payer: Self-pay

## 2020-11-28 ENCOUNTER — Encounter: Payer: Self-pay | Admitting: Obstetrics and Gynecology

## 2020-11-28 VITALS — BP 118/53 | HR 70 | Temp 98.1°F | Wt 132.4 lb

## 2020-11-28 DIAGNOSIS — Z30432 Encounter for removal of intrauterine contraceptive device: Secondary | ICD-10-CM | POA: Diagnosis not present

## 2020-11-28 DIAGNOSIS — Z30011 Encounter for initial prescription of contraceptive pills: Secondary | ICD-10-CM

## 2020-11-28 MED ORDER — BALCOLTRA 0.1-20 MG-MCG(21) PO TABS: 1.0000 | ORAL_TABLET | Freq: Every day | ORAL | 0 refills | Status: AC

## 2020-11-28 NOTE — Addendum Note (Signed)
Addended by: Clovis Pu on: 11/28/2020 04:19 PM   Modules accepted: Orders

## 2020-11-28 NOTE — Progress Notes (Signed)
   IUD Removal Procedure Note   Patient is 20 y.o. [id number] who is here for Liletta IUD removal. She would like IUD removed secondary to constant pain and irregular bleeding every since she had the IUD . She understands that she could get pregnant after removal of IUD if she does not use another form of contraception. She has no other complaints today. Reviewed risks of removal including pain, bleeding, difficult removal and inability to remove IUD which may require surgical removal in OR. She affirms that she would like IUD removed.  BP (!) 118/53 (BP Location: Left Arm, Patient Position: Sitting, Cuff Size: Normal)   Pulse 70   Temp 98.1 F (36.7 C) (Oral)   Wt 132 lb 6.4 oz (60.1 kg)   Breastfeeding No   BMI 20.74 kg/m   Patient with normal appearing external female genitalia. Graves speculum placed in vagina and IUD strings easily visualized. Strings grasped with ring forceps and removed easily. Minimal bleeding noted. All instruments removed from vagina. Patient tolerated procedure very well.    She was given post removal instructions. She is planning on using OCP for contraception. Sample of Bal[place]tra given. Patient advised to call the office for more samples before the end of the pack and Dorisann Frames, RN can send in Rx for the [place].   Return 1 year for annual or prn.   [name], MSN, CNM 11/28/2020 11:17 AM

## 2021-02-23 IMAGING — US US MFM OB FOLLOW-UP
1 series · 14 of 28 positions shown · non-contrast
Comparison: none

[Series 1: us mfm ob follow-up · 44 acquisitions, 14 frames shown]
[im 2/44]
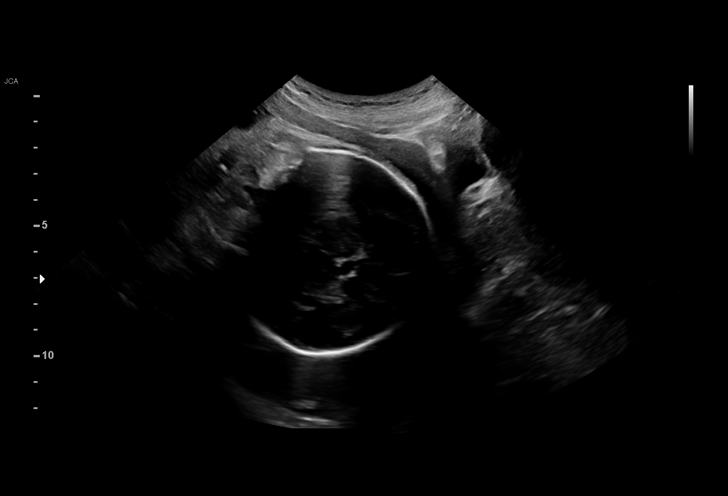
[im 5/44]
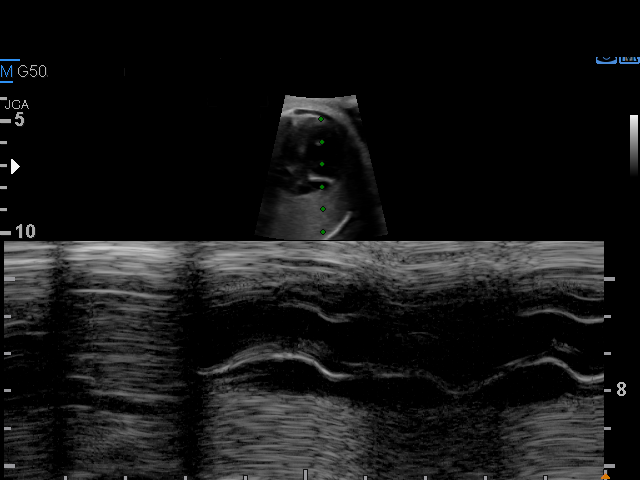
[im 8/44]
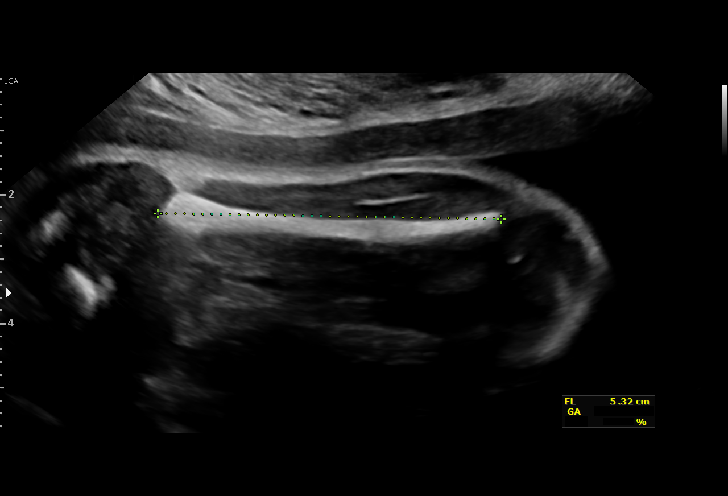
[im 12/44]
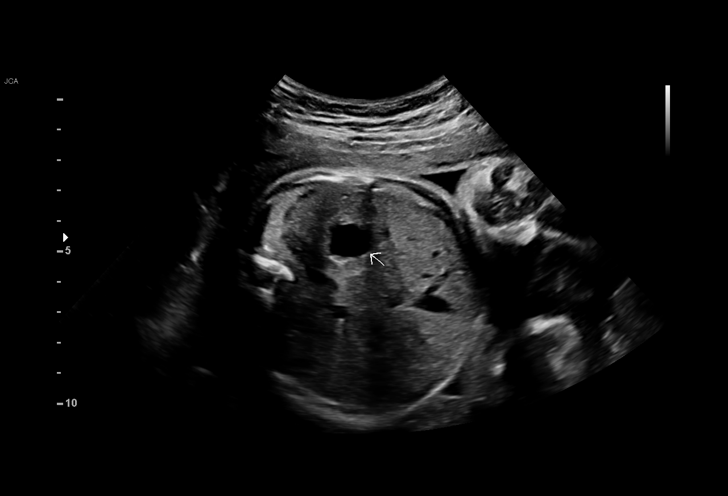
[im 15/44]
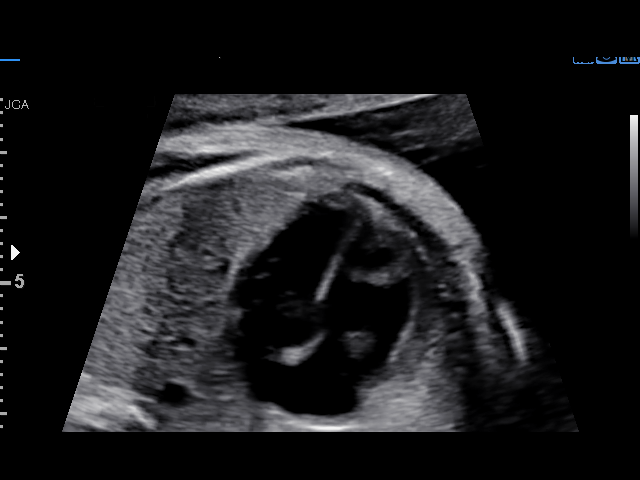
[im 18/44]
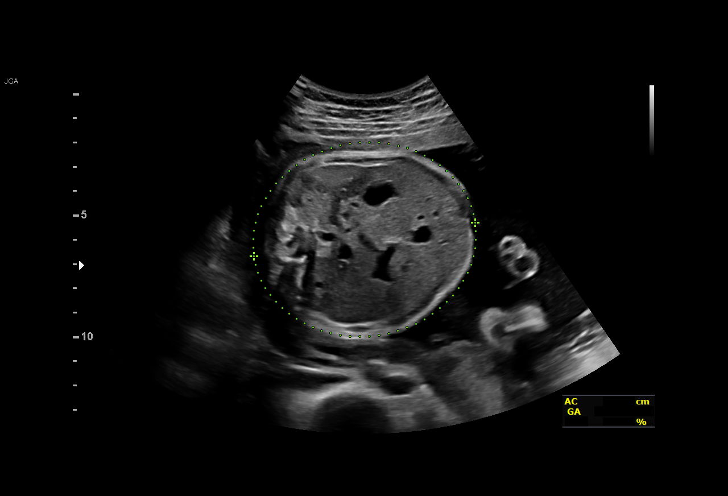
[im 21/44]
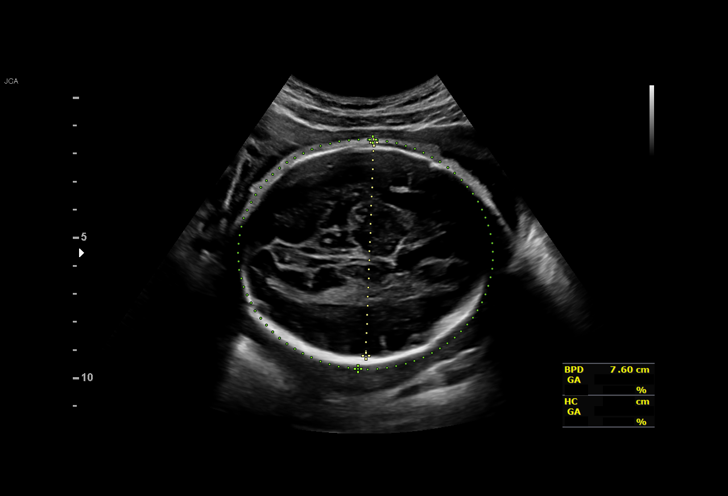
[im 24/44]
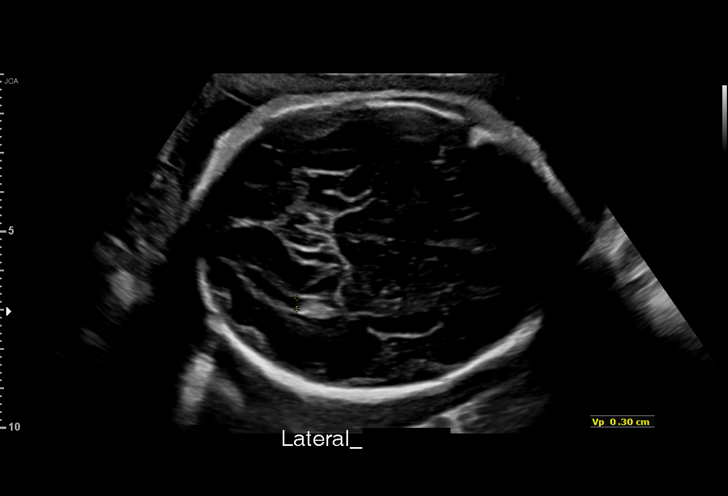
[im 28/44]
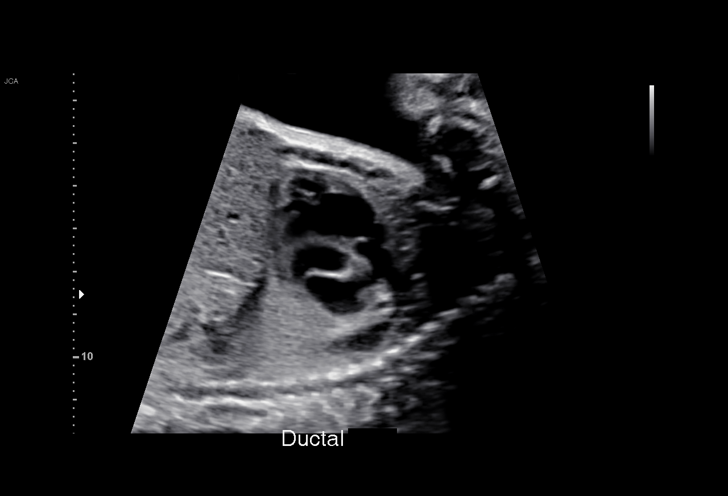
[im 31/44]
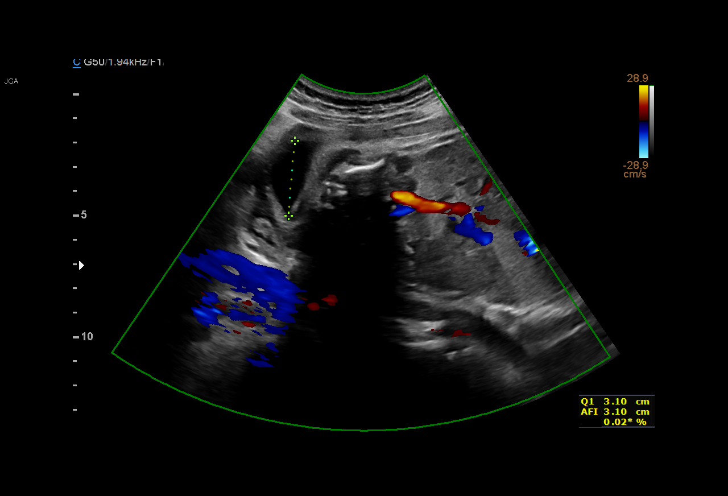
[im 34/44]
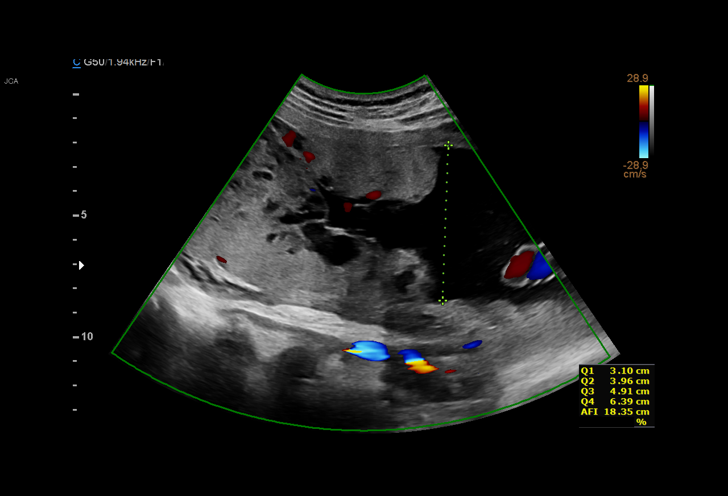
[im 37/44]
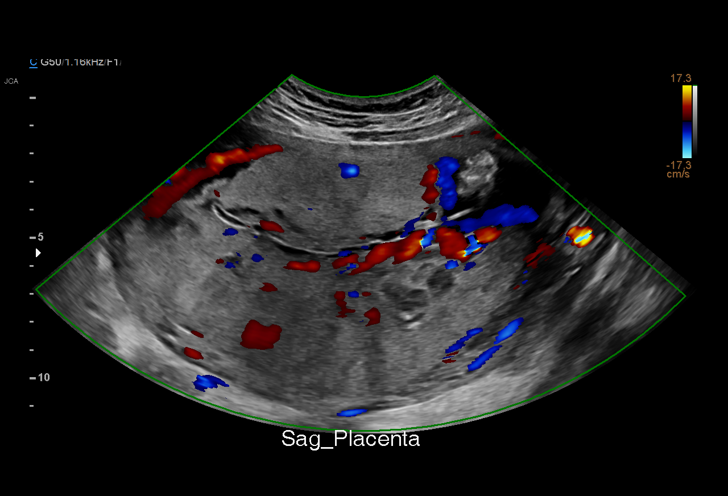
[im 40/44]
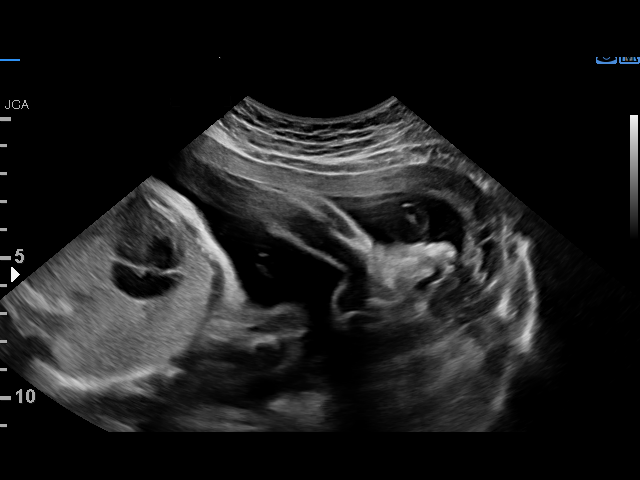
[im 44/44]
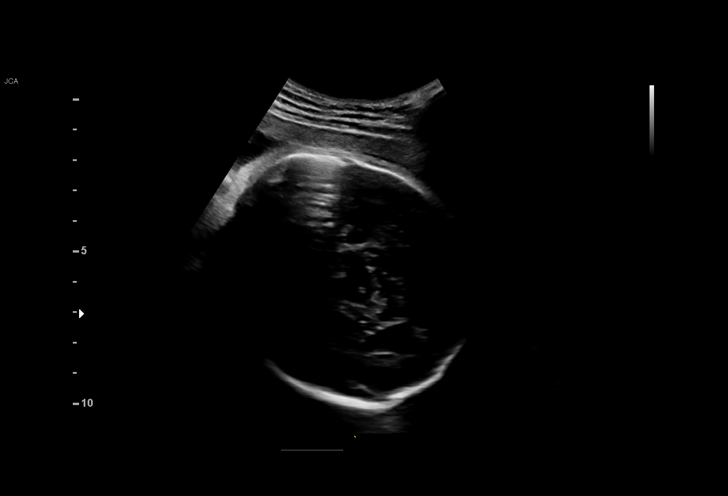

[14 of 28 positions shown; findings below may reference images not displayed]

[REDACTED]

                                                      URIEL ANTONIO

Indications

 28 weeks gestation of pregnancy
 Encounter for antenatal screening for
 malformations (low risk Nips)
 Genetic carrier (SMA)
 Late prenatal care, second trimester
Fetal Evaluation

 Num Of Fetuses:         1
 Fetal Heart Rate(bpm):  145
 Cardiac Activity:       Observed
 Presentation:           Cephalic
 Placenta:               Posterior
 P. Cord Insertion:      Visualized

 Amniotic Fluid
 AFI FV:      Within normal limits

 AFI Sum(cm)     %Tile       Largest Pocket(cm)
 18.36           70

 RUQ(cm)       RLQ(cm)       LUQ(cm)        LLQ(cm)


 Comment:    Placental cystic masses noted on today's ultrasound measuring
             2.5 x 1.4 x 1.4cm
Biometry

 BPD:      75.9  mm     G. Age:  30w 3d         90  %    CI:        79.11   %    70 - 86
                                                         FL/HC:      19.3   %    19.6 -
 HC:      269.8  mm     G. Age:  29w 3d         43  %    HC/AC:      1.00        0.99 -
 AC:      270.3  mm     G. Age:  31w 1d         97  %    FL/BPD:     68.8   %    71 - 87
 FL:       52.2  mm     G. Age:  27w 6d         16  %    FL/AC:      19.3   %    20 - 24
 HUM:      46.7  mm     G. Age:  27w 4d         23  %

 LV:          3  mm

 Est. FW:    8884  gm      3 lb 4 oz     84  %
OB History

 Gravidity:    2          SAB:   1
Gestational Age

 LMP:           28w 4d        Date:  01/01/20                 EDD:   10/07/20
 U/S Today:     29w 5d                                        EDD:   09/29/20
 Best:          28w 4d     Det. By:  LMP  (01/01/20)          EDD:   10/07/20
Anatomy

 Cranium:               Appears normal         LVOT:                   Previously seen
 Cavum:                 Appears normal         Aortic Arch:            Previously seen
 Ventricles:            Appears normal         Ductal Arch:            Appears normal
 Choroid Plexus:        Previously seen        Diaphragm:              Appears normal
 Cerebellum:            Previously seen        Stomach:                Appears normal, left
                                                                       sided
 Posterior Fossa:       Previously seen        Abdomen:                Appears normal
 Nuchal Fold:           Not applicable (>20    Abdominal Wall:         Previously seen
                        wks GA)
 Face:                  Orbits and profile     Cord Vessels:           Previously seen
                        previously seen
 Lips:                  Previously seen        Kidneys:                Previously seen
 Palate:                Previously seen        Bladder:                Appears normal
 Thoracic:              Appears normal         Spine:                  Previously seen
 Heart:                 Appears normal         Upper Extremities:      Previously seen
                        (4CH, axis, and
                        situs)
 RVOT:                  Previously seen        Lower Extremities:      Previously seen

 Other:  Fetus appears to be a male. Heels and 5th digit prev visualized.
         Nasal bone prev visualized.
Impression

 A small umbilical cord cyst was seen on previous ultrasound.
 Patient return for ultrasound evaluation.
 She does not have gestational diabetes.
 Fetal growth is appropriate for gestational age .Amniotic fluid
 is normal and good fetal activity is seen .  Ductal arch
 appears normal.  Small umbilical cord cyst close to the
 placental cord insertion site, measuring 2.5 x 1.4x 1.4 cm, is
 seen, which is slightly reduced from previous scan.  Color-
 flow Doppler is negative.

 I have reassured her that it is unlikely to cause adverse
 outcomes in pregnancy.
Recommendations

 -An appointment was made for her to return in 46 weeks for
 fetal growth assessment.
                 RADHAKRISHNAN

## 2021-07-09 ENCOUNTER — Other Ambulatory Visit: Payer: Self-pay

## 2021-07-09 DIAGNOSIS — Z30011 Encounter for initial prescription of contraceptive pills: Secondary | ICD-10-CM

## 2022-01-13 ENCOUNTER — Encounter: Payer: Medicaid Other | Admitting: Obstetrics & Gynecology

## 2022-03-28 ENCOUNTER — Encounter (HOSPITAL_COMMUNITY): Payer: Self-pay | Admitting: *Deleted

## 2022-03-28 ENCOUNTER — Other Ambulatory Visit: Payer: Self-pay

## 2022-03-28 ENCOUNTER — Ambulatory Visit (HOSPITAL_COMMUNITY)
Admission: EM | Admit: 2022-03-28 | Discharge: 2022-03-28 | Disposition: A | Discharge status: Home / Self Care | Payer: Medicaid Other | Attending: Physician Assistant | Admitting: Physician Assistant

## 2022-03-28 DIAGNOSIS — G9331 Postviral fatigue syndrome: Secondary | ICD-10-CM | POA: Diagnosis not present

## 2022-03-28 DIAGNOSIS — R112 Nausea with vomiting, unspecified: Secondary | ICD-10-CM | POA: Diagnosis not present

## 2022-03-28 DIAGNOSIS — Z113 Encounter for screening for infections with a predominantly sexual mode of transmission: Secondary | ICD-10-CM | POA: Diagnosis present

## 2022-03-28 DIAGNOSIS — R197 Diarrhea, unspecified: Secondary | ICD-10-CM | POA: Diagnosis not present

## 2022-03-28 LAB — HIV ANTIBODY (ROUTINE TESTING W REFLEX): HIV Screen 4th Generation wRfx: NONREACTIVE

## 2022-03-28 MED ORDER — ONDANSETRON HCL 4 MG/2ML IJ SOLN
INTRAMUSCULAR | Status: AC
  Filled 2022-03-28: qty 2

## 2022-03-28 MED ORDER — ONDANSETRON HCL 4 MG PO TABS: 4.0000 mg | ORAL_TABLET | Freq: Three times a day (TID) | ORAL | 0 refills | Status: AC | PRN

## 2022-03-28 MED ORDER — ONDANSETRON HCL 4 MG/2ML IJ SOLN
4.0000 mg | Freq: Once | INTRAMUSCULAR | Status: AC
  Administered 2022-03-28: 4 mg via INTRAMUSCULAR

## 2022-03-28 MED ORDER — ONDANSETRON 4 MG PO TBDP
ORAL_TABLET | ORAL | Status: AC
  Filled 2022-03-28: qty 1

## 2022-03-28 NOTE — ED Provider Notes (Signed)
?[facility] ? ? ? ?CSN: [id number] ?Arrival date & time: 03/28/22  1105 ? ? ?  ? ?History   ?Chief Complaint ?Chief Complaint  ?Patient presents with  ? Diarrhea  ? Nausea  ? Abdominal Pain  ? SEXUALLY TRANSMITTED DISEASE  ? ? ?HPI ?Alyssa Mercado is a 21 y.o. female.  ? ?21 year old female presents with nausea, vomiting, and diarrhea.  Patient relates that her son has similar type symptoms over the past couple days.  Patient started yesterday with abdominal cramping, repeated vomiting, diarrhea with loose watery stools.  The symptoms have been frequent, but without fever patient has had fatigue, chills, and body aches and pain.  Patient relates she is drinking fluids, small amounts and is unable to work with her symptoms.  No ? ?Patient indicates she desires to be checked for STIs.  Patient relates her last menses was at the beginning of May and it was normal.  Patient relates that her last sexual contact was about 2 months ago with her then boyfriend.  Patient relates that it was unprotected.  Patient denies any vaginal type symptoms, or vaginal discharge.  Patient is just concerned and wants to be checked.  Patient did relate that her last STI check was 6 months ago and the results were all normal. ? ? ?Diarrhea ?Associated symptoms: abdominal pain and vomiting   ?Abdominal Pain ?Associated symptoms: diarrhea, nausea and vomiting   ? ?Past Medical History:  ?Diagnosis Date  ? Asthma   ? ? ?Patient Active Problem List  ? Diagnosis Date Noted  ? Preterm premature rupture of membranes (PPROM) with unknown onset of labor 09/07/2020  ? Abnormal genetic test during pregnancy 06/06/2020  ? Gastroesophageal reflux during pregnancy in second trimester, antepartum 06/06/2020  ? Morning sickness 06/06/2020  ? ? ?Past Surgical History:  ?Procedure Laterality Date  ? KNEE SURGERY    ? ? ?OB History   ? ? Gravida  ?3  ? Para  ?1  ? Term  ?   ? Preterm  ?1  ? AB  ?2  ? Living  ?1  ?  ? ? SAB  ?   ? IAB   ?1  ? Ectopic  ?   ? Multiple  ?   ? Live Births  ?1  ?   ?  ?  ? ? ? ?Home Medications   ? ?Prior to Admission medications   ?Medication Sig Start Date End Date Taking? Authorizing Provider  ?acetaminophen (TYLENOL) 500 MG tablet Take 2 tablets (1,000 mg total) by mouth every 8 (eight) hours. 09/08/20   Sheila Oats, MD  ?coconut oil OIL Apply 1 application topically as needed (nipple pain). 09/08/20   Sheila Oats, MD  ?ibuprofen (ADVIL) 800 MG tablet Take 1 tablet (800 mg total) by mouth every 8 (eight) hours. 09/08/20   Sheila Oats, MD  ?Levonorgest-Eth Estrad-Fe Bisg (BALCOLTRA) 0.1-20 MG-MCG(21) TABS Take 1 tablet by mouth daily at 6 (six) AM. 11/28/20   [name], CNM  ?Prenatal Vit-Fe Phos-FA-Omega (VITAFOL GUMMIES) 3.33-0.333-34.8 MG CHEW Chew 3 each by mouth daily. 04/08/20   [name], CNM  ?cetirizine (ZYRTEC) 10 MG tablet Take 1 tablet (10 mg total) by mouth daily. 07/04/16 03/04/20  Tharon Aquas, PA  ? ? ?Family History ?Family History  ?Problem Relation Age of Onset  ? Hypertension Father   ? ? ?Social History ?Social History  ? ?Tobacco Use  ? Smoking status: Never  ? Smokeless tobacco: Never  ?  Vaping Use  ? Vaping Use: Never used  ?Substance Use Topics  ? Alcohol use: No  ? Drug use: Not Currently  ?  Types: Marijuana  ? ? ? ?Allergies   ?Patient has no known allergies. ? ? ?Review of Systems ?Review of Systems  ?Gastrointestinal:  Positive for abdominal pain, diarrhea, nausea and vomiting.  ? ? ?Physical Exam ?Triage Vital Signs ?ED Triage Vitals  ?Enc Vitals Group  ?   BP 03/28/22 1151 112/78  ?   Pulse Rate 03/28/22 1151 100  ?   Resp 03/28/22 1151 18  ?   Temp 03/28/22 1151 98.1 ?F (36.7 ?C)  ?   Temp src --   ?   SpO2 03/28/22 1151 99 %  ?   Weight --   ?   Height --   ?   Head Circumference --   ?   Peak Flow --   ?   Pain Score 03/28/22 1149 0  ?   Pain Loc --   ?   Pain Edu? --   ?   Excl. in GC? --   ? ?No data found. ? ?Updated Vital Signs ?BP 112/78   Pulse 100    Temp 98.1 ?F (36.7 ?C)   Resp 18   LMP 03/15/2022   SpO2 99%  ? ?Visual Acuity ?Right Eye Distance:   ?Left Eye Distance:   ?Bilateral Distance:   ? ?Right Eye Near:   ?Left Eye Near:    ?Bilateral Near:    ? ?Physical Exam ?Constitutional:   ?   Appearance: She is well-developed.  ?HENT:  ?   Mouth/Throat:  ?   Comments: Mouth: Pharynx is clear without redness, and moist. ?Neck:  ?   Comments: Neck: Cervical adenopathy noted bilaterally. ?Cardiovascular:  ?   Rate and Rhythm: Normal rate and regular rhythm.  ?Pulmonary:  ?   Comments: Lungs: Sounds without rales, rhonchi, or wheezes bilaterally. ?Abdominal:  ?   Comments: Abdomen: Normal bowel sounds all quadrants, no masses, guarding, or tenderness.  No rebound.  ?Neurological:  ?   Mental Status: She is alert.  ? ? ? ?UC Treatments / Results  ?Labs ?(all labs ordered are listed, but only abnormal results are displayed) ?Labs Reviewed  ?RPR  ?HIV ANTIBODY (ROUTINE TESTING W REFLEX)  ?CERVICOVAGINAL ANCILLARY ONLY  ? ? ?EKG ? ? ?Radiology ?No results found. ? ?Procedures ?Procedures (including critical care time) ? ?Medications Ordered in UC ?Medications  ?ondansetron (ZOFRAN) injection 4 mg (has no administration in time range)  ? ? ?Initial Impression / Assessment and Plan / UC Course  ?I have reviewed the triage vital signs and the nursing notes. ? ?Pertinent labs & imaging results that were available during my care of the patient were reviewed by me and considered in my medical decision making (see chart for details). ? ?  ?Plan: ?STI cultures, HIV, and RPR testing are pending ?Advised patient to continue with clear liquids and bland diet. ?Advised patient take medications as directed. ?Follow-up with PCP return if symptoms fail to improve. ?Final Clinical Impressions(s) / UC Diagnoses  ? ?Final diagnoses:  ?Routine screening for STI (sexually transmitted infection)  ?Nausea and vomiting, unspecified vomiting type  ?Diarrhea, unspecified type  ?Postviral  fatigue syndrome  ? ? ? ?Discharge Instructions   ? ?  ?Advised to increase fluid intake, bland diet, clear liquids, chicken or beef broth, saltine crackers. ?Advised take medication as directed. ?Advised to follow-up with PCP if symptoms fail to  improve in the next 7 to 10 days. ? ? ?ED Prescriptions   ?None ?  ? ?PDMP not reviewed this encounter. ?  ?Ellsworth Lennox, PA-C ?03/28/22 1241 ? ?

## 2022-03-28 NOTE — Discharge Instructions (Addendum)
Advised to increase fluid intake, bland diet, clear liquids, chicken or beef broth, saltine crackers. ?Advised take medication as directed. ?Advised to follow-up with PCP if symptoms fail to improve in the next 7 to 10 days. ?

## 2022-03-28 NOTE — ED Triage Notes (Signed)
Pt reports N/V/D started 2 days ago. Pt also wants an STD check no Sx's. ?

## 2022-03-29 LAB — RPR: RPR Ser Ql: NONREACTIVE

## 2022-03-30 LAB — CERVICOVAGINAL ANCILLARY ONLY
Bacterial Vaginitis (gardnerella): POSITIVE — AB
Candida Glabrata: NEGATIVE
Candida Vaginitis: NEGATIVE
Chlamydia: NEGATIVE
Comment: NEGATIVE
Comment: NEGATIVE
Comment: NEGATIVE
Comment: NEGATIVE
Comment: NEGATIVE
Comment: NORMAL
Neisseria Gonorrhea: NEGATIVE
Trichomonas: NEGATIVE

## 2022-03-31 ENCOUNTER — Telehealth (HOSPITAL_COMMUNITY): Payer: Self-pay | Admitting: Emergency Medicine

## 2022-03-31 MED ORDER — METRONIDAZOLE 500 MG PO TABS: 500.0000 mg | ORAL_TABLET | Freq: Two times a day (BID) | ORAL | 0 refills | Status: AC
